# Patient Record
Sex: Female | Born: 1965 | Race: White | Hispanic: No | Marital: Married | Smoking: Former smoker
Health system: Southern US, Community
[De-identification: ages and names within clinical notes are randomized; demographics above are authoritative.]

## PROBLEM LIST (undated history)

## (undated) DIAGNOSIS — E785 Hyperlipidemia, unspecified: Secondary | ICD-10-CM

## (undated) DIAGNOSIS — Z0000 Encounter for general adult medical examination without abnormal findings: Secondary | ICD-10-CM

## (undated) DIAGNOSIS — E663 Overweight: Secondary | ICD-10-CM

## (undated) DIAGNOSIS — L578 Other skin changes due to chronic exposure to nonionizing radiation: Secondary | ICD-10-CM

## (undated) DIAGNOSIS — G4700 Insomnia, unspecified: Secondary | ICD-10-CM

## (undated) DIAGNOSIS — K219 Gastro-esophageal reflux disease without esophagitis: Secondary | ICD-10-CM

## (undated) DIAGNOSIS — B019 Varicella without complication: Secondary | ICD-10-CM

## (undated) DIAGNOSIS — F419 Anxiety disorder, unspecified: Secondary | ICD-10-CM

## (undated) DIAGNOSIS — E162 Hypoglycemia, unspecified: Secondary | ICD-10-CM

## (undated) DIAGNOSIS — G43909 Migraine, unspecified, not intractable, without status migrainosus: Secondary | ICD-10-CM

## (undated) DIAGNOSIS — IMO0002 Reserved for concepts with insufficient information to code with codable children: Secondary | ICD-10-CM

## (undated) DIAGNOSIS — Z8489 Family history of other specified conditions: Secondary | ICD-10-CM

## (undated) DIAGNOSIS — Z8669 Personal history of other diseases of the nervous system and sense organs: Secondary | ICD-10-CM

## (undated) DIAGNOSIS — D249 Benign neoplasm of unspecified breast: Secondary | ICD-10-CM

## (undated) DIAGNOSIS — L409 Psoriasis, unspecified: Secondary | ICD-10-CM

## (undated) DIAGNOSIS — E559 Vitamin D deficiency, unspecified: Principal | ICD-10-CM

## (undated) DIAGNOSIS — Z972 Presence of dental prosthetic device (complete) (partial): Secondary | ICD-10-CM

## (undated) DIAGNOSIS — E049 Nontoxic goiter, unspecified: Secondary | ICD-10-CM

## (undated) DIAGNOSIS — Z973 Presence of spectacles and contact lenses: Secondary | ICD-10-CM

## (undated) DIAGNOSIS — F329 Major depressive disorder, single episode, unspecified: Secondary | ICD-10-CM

## (undated) DIAGNOSIS — E1165 Type 2 diabetes mellitus with hyperglycemia: Secondary | ICD-10-CM

## (undated) DIAGNOSIS — F32A Depression, unspecified: Secondary | ICD-10-CM

## (undated) DIAGNOSIS — K08109 Complete loss of teeth, unspecified cause, unspecified class: Secondary | ICD-10-CM

## (undated) HISTORY — DX: Depression, unspecified: F32.A

## (undated) HISTORY — DX: Vitamin D deficiency, unspecified: E55.9

## (undated) HISTORY — DX: Gastro-esophageal reflux disease without esophagitis: K21.9

## (undated) HISTORY — DX: Benign neoplasm of unspecified breast: D24.9

## (undated) HISTORY — DX: Encounter for general adult medical examination without abnormal findings: Z00.00

## (undated) HISTORY — DX: Psoriasis, unspecified: L40.9

## (undated) HISTORY — DX: Other skin changes due to chronic exposure to nonionizing radiation: L57.8

## (undated) HISTORY — DX: Insomnia, unspecified: G47.00

## (undated) HISTORY — DX: Migraine, unspecified, not intractable, without status migrainosus: G43.909

## (undated) HISTORY — DX: Hyperlipidemia, unspecified: E78.5

## (undated) HISTORY — DX: Hypoglycemia, unspecified: E16.2

## (undated) HISTORY — DX: Nontoxic goiter, unspecified: E04.9

## (undated) HISTORY — DX: Varicella without complication: B01.9

## (undated) HISTORY — DX: Reserved for concepts with insufficient information to code with codable children: IMO0002

## (undated) HISTORY — DX: Personal history of other diseases of the nervous system and sense organs: Z86.69

## (undated) HISTORY — DX: Major depressive disorder, single episode, unspecified: F32.9

## (undated) HISTORY — DX: Anxiety disorder, unspecified: F41.9

## (undated) HISTORY — DX: Overweight: E66.3

## (undated) HISTORY — DX: Type 2 diabetes mellitus with hyperglycemia: E11.65

---

## 1985-08-26 HISTORY — PX: TUBAL LIGATION: SHX77

## 1998-08-26 LAB — HM MAMMOGRAPHY: HM Mammogram: NORMAL

## 2004-08-26 DIAGNOSIS — E162 Hypoglycemia, unspecified: Secondary | ICD-10-CM

## 2004-08-26 HISTORY — DX: Hypoglycemia, unspecified: E16.2

## 2006-08-26 LAB — HM PAP SMEAR: HM Pap smear: NORMAL

## 2011-12-19 ENCOUNTER — Other Ambulatory Visit (HOSPITAL_COMMUNITY)
Admission: RE | Admit: 2011-12-19 | Discharge: 2011-12-19 | Disposition: A | Discharge status: Home / Self Care | Payer: BC Managed Care – PPO | Source: Ambulatory Visit | Attending: Family Medicine | Admitting: Family Medicine

## 2011-12-19 ENCOUNTER — Encounter: Payer: Self-pay | Admitting: Family Medicine

## 2011-12-19 ENCOUNTER — Ambulatory Visit (INDEPENDENT_AMBULATORY_CARE_PROVIDER_SITE_OTHER): Payer: BC Managed Care – PPO | Admitting: Family Medicine

## 2011-12-19 VITALS — BP 123/84 | HR 94 | Temp 98.5°F | Ht 62.5 in | Wt 120.4 lb

## 2011-12-19 DIAGNOSIS — Z124 Encounter for screening for malignant neoplasm of cervix: Secondary | ICD-10-CM

## 2011-12-19 DIAGNOSIS — E119 Type 2 diabetes mellitus without complications: Secondary | ICD-10-CM

## 2011-12-19 DIAGNOSIS — Z23 Encounter for immunization: Secondary | ICD-10-CM

## 2011-12-19 DIAGNOSIS — N760 Acute vaginitis: Secondary | ICD-10-CM | POA: Insufficient documentation

## 2011-12-19 DIAGNOSIS — Z113 Encounter for screening for infections with a predominantly sexual mode of transmission: Secondary | ICD-10-CM | POA: Insufficient documentation

## 2011-12-19 DIAGNOSIS — K219 Gastro-esophageal reflux disease without esophagitis: Secondary | ICD-10-CM

## 2011-12-19 DIAGNOSIS — L408 Other psoriasis: Secondary | ICD-10-CM

## 2011-12-19 DIAGNOSIS — Z01419 Encounter for gynecological examination (general) (routine) without abnormal findings: Secondary | ICD-10-CM | POA: Insufficient documentation

## 2011-12-19 DIAGNOSIS — E162 Hypoglycemia, unspecified: Secondary | ICD-10-CM

## 2011-12-19 DIAGNOSIS — Z8669 Personal history of other diseases of the nervous system and sense organs: Secondary | ICD-10-CM | POA: Insufficient documentation

## 2011-12-19 DIAGNOSIS — F341 Dysthymic disorder: Secondary | ICD-10-CM

## 2011-12-19 DIAGNOSIS — K649 Unspecified hemorrhoids: Secondary | ICD-10-CM

## 2011-12-19 DIAGNOSIS — J309 Allergic rhinitis, unspecified: Secondary | ICD-10-CM

## 2011-12-19 DIAGNOSIS — E1165 Type 2 diabetes mellitus with hyperglycemia: Secondary | ICD-10-CM | POA: Insufficient documentation

## 2011-12-19 DIAGNOSIS — F419 Anxiety disorder, unspecified: Secondary | ICD-10-CM

## 2011-12-19 DIAGNOSIS — Z0000 Encounter for general adult medical examination without abnormal findings: Secondary | ICD-10-CM

## 2011-12-19 DIAGNOSIS — B379 Candidiasis, unspecified: Secondary | ICD-10-CM

## 2011-12-19 DIAGNOSIS — F32A Depression, unspecified: Secondary | ICD-10-CM | POA: Insufficient documentation

## 2011-12-19 DIAGNOSIS — F329 Major depressive disorder, single episode, unspecified: Secondary | ICD-10-CM | POA: Insufficient documentation

## 2011-12-19 DIAGNOSIS — L409 Psoriasis, unspecified: Secondary | ICD-10-CM

## 2011-12-19 DIAGNOSIS — E049 Nontoxic goiter, unspecified: Secondary | ICD-10-CM

## 2011-12-19 LAB — HEPATIC FUNCTION PANEL
ALT: 14 U/L (ref 0–35)
Alkaline Phosphatase: 82 U/L (ref 39–117)
Bilirubin, Direct: 0 mg/dL (ref 0.0–0.3)
Total Bilirubin: 0.2 mg/dL — ABNORMAL LOW (ref 0.3–1.2)

## 2011-12-19 LAB — CBC
MCV: 90.8 fl (ref 78.0–100.0)
RBC: 4.47 Mil/uL (ref 3.87–5.11)
WBC: 7 10*3/uL (ref 4.5–10.5)

## 2011-12-19 LAB — RENAL FUNCTION PANEL
Calcium: 9 mg/dL (ref 8.4–10.5)
Chloride: 101 mEq/L (ref 96–112)
Glucose, Bld: 269 mg/dL — ABNORMAL HIGH (ref 70–99)
Phosphorus: 2.5 mg/dL (ref 2.3–4.6)
Potassium: 3.4 mEq/L — ABNORMAL LOW (ref 3.5–5.1)
Sodium: 136 mEq/L (ref 135–145)

## 2011-12-19 LAB — LIPID PANEL
Cholesterol: 220 mg/dL — ABNORMAL HIGH (ref 0–200)
HDL: 47.9 mg/dL
Total CHOL/HDL Ratio: 5
Triglycerides: 118 mg/dL (ref 0.0–149.0)
VLDL: 23.6 mg/dL (ref 0.0–40.0)

## 2011-12-19 LAB — LDL CHOLESTEROL, DIRECT: Direct LDL: 152.7 mg/dL

## 2011-12-19 MED ORDER — NYSTATIN-TRIAMCINOLONE 100000-0.1 UNIT/GM-% EX OINT: TOPICAL_OINTMENT | Freq: Two times a day (BID) | CUTANEOUS | Status: DC | PRN

## 2011-12-19 MED ORDER — FLUCONAZOLE 150 MG PO TABS: ORAL_TABLET | ORAL | Status: DC

## 2011-12-19 NOTE — Patient Instructions (Signed)
Preventive Care for Adults, Female A healthy lifestyle and preventive care can promote health and wellness. Preventive health guidelines for women include the following key practices.  A routine yearly physical is a good way to check with your caregiver about your health and preventive screening. It is a chance to share any concerns and updates on your health, and to receive a thorough exam.   Visit your dentist for a routine exam and preventive care every 6 months. Brush your teeth twice a day and floss once a day. Good oral hygiene prevents tooth decay and gum disease.   The frequency of eye exams is based on your age, health, family medical history, use of contact lenses, and other factors. Follow your caregiver's recommendations for frequency of eye exams.   Eat a healthy diet. Foods like vegetables, fruits, whole grains, low-fat dairy products, and lean protein foods contain the nutrients you need without too many calories. Decrease your intake of foods high in solid fats, added sugars, and salt. Eat the right amount of calories for you.Get information about a proper diet from your caregiver, if necessary.   Regular physical exercise is one of the most important things you can do for your health. Most adults should get at least 150 minutes of moderate-intensity exercise (any activity that increases your heart rate and causes you to sweat) each week. In addition, most adults need muscle-strengthening exercises on 2 or more days a week.   Maintain a healthy weight. The body mass index (BMI) is a screening tool to identify possible weight problems. It provides an estimate of body fat based on height and weight. Your caregiver can help determine your BMI, and can help you achieve or maintain a healthy weight.For adults 20 years and older:   A BMI below 18.5 is considered underweight.   A BMI of 18.5 to 24.9 is normal.   A BMI of 25 to 29.9 is considered overweight.   A BMI of 30 and above is  considered obese.   Maintain normal blood lipids and cholesterol levels by exercising and minimizing your intake of saturated fat. Eat a balanced diet with plenty of fruit and vegetables. Blood tests for lipids and cholesterol should begin at age 20 and be repeated every 5 years. If your lipid or cholesterol levels are high, you are over 50, or you are at high risk for heart disease, you may need your cholesterol levels checked more frequently.Ongoing high lipid and cholesterol levels should be treated with medicines if diet and exercise are not effective.   If you smoke, find out from your caregiver how to quit. If you do not use tobacco, do not start.   If you are pregnant, do not drink alcohol. If you are breastfeeding, be very cautious about drinking alcohol. If you are not pregnant and choose to drink alcohol, do not exceed 1 drink per day. One drink is considered to be 12 ounces (355 mL) of beer, 5 ounces (148 mL) of wine, or 1.5 ounces (44 mL) of liquor.   Avoid use of street drugs. Do not share needles with anyone. Ask for help if you need support or instructions about stopping the use of drugs.   High blood pressure causes heart disease and increases the risk of stroke. Your blood pressure should be checked at least every 1 to 2 years. Ongoing high blood pressure should be treated with medicines if weight loss and exercise are not effective.   If you are 55 to 48   years old, ask your caregiver if you should take aspirin to prevent strokes.   Diabetes screening involves taking a blood sample to check your fasting blood sugar level. This should be done once every 3 years, after age 45, if you are within normal weight and without risk factors for diabetes. Testing should be considered at a younger age or be carried out more frequently if you are overweight and have at least 1 risk factor for diabetes.   Breast cancer screening is essential preventive care for women. You should practice "breast  self-awareness." This means understanding the normal appearance and feel of your breasts and may include breast self-examination. Any changes detected, no matter how small, should be reported to a caregiver. Women in their 20s and 30s should have a clinical breast exam (CBE) by a caregiver as part of a regular health exam every 1 to 3 years. After age 40, women should have a CBE every year. Starting at age 40, women should consider having a mammography (breast X-ray test) every year. Women who have a family history of breast cancer should talk to their caregiver about genetic screening. Women at a high risk of breast cancer should talk to their caregivers about having magnetic resonance imaging (MRI) and a mammography every year.   The Pap test is a screening test for cervical cancer. A Pap test can show cell changes on the cervix that might become cervical cancer if left untreated. A Pap test is a procedure in which cells are obtained and examined from the lower end of the uterus (cervix).   Women should have a Pap test starting at age 21.   Between ages 21 and 29, Pap tests should be repeated every 2 years.   Beginning at age 30, you should have a Pap test every 3 years as long as the past 3 Pap tests have been normal.   Some women have medical problems that increase the chance of getting cervical cancer. Talk to your caregiver about these problems. It is especially important to talk to your caregiver if a new problem develops soon after your last Pap test. In these cases, your caregiver may recommend more frequent screening and Pap tests.   The above recommendations are the same for women who have or have not gotten the vaccine for human papillomavirus (HPV).   If you had a hysterectomy for a problem that was not cancer or a condition that could lead to cancer, then you no longer need Pap tests. Even if you no longer need a Pap test, a regular exam is a good idea to make sure no other problems are  starting.   If you are between ages 65 and 70, and you have had normal Pap tests going back 10 years, you no longer need Pap tests. Even if you no longer need a Pap test, a regular exam is a good idea to make sure no other problems are starting.   If you have had past treatment for cervical cancer or a condition that could lead to cancer, you need Pap tests and screening for cancer for at least 20 years after your treatment.   If Pap tests have been discontinued, risk factors (such as a new sexual partner) need to be reassessed to determine if screening should be resumed.   The HPV test is an additional test that may be used for cervical cancer screening. The HPV test looks for the virus that can cause the cell changes on the cervix.   The cells collected during the Pap test can be tested for HPV. The HPV test could be used to screen women aged 30 years and older, and should be used in women of any age who have unclear Pap test results. After the age of 30, women should have HPV testing at the same frequency as a Pap test.   Colorectal cancer can be detected and often prevented. Most routine colorectal cancer screening begins at the age of 50 and continues through age 75. However, your caregiver may recommend screening at an earlier age if you have risk factors for colon cancer. On a yearly basis, your caregiver may provide home test kits to check for hidden blood in the stool. Use of a small camera at the end of a tube, to directly examine the colon (sigmoidoscopy or colonoscopy), can detect the earliest forms of colorectal cancer. Talk to your caregiver about this at age 50, when routine screening begins. Direct examination of the colon should be repeated every 5 to 10 years through age 75, unless early forms of pre-cancerous polyps or small growths are found.   Hepatitis C blood testing is recommended for all people born from 1945 through 1965 and any individual with known risks for hepatitis C.    Practice safe sex. Use condoms and avoid high-risk sexual practices to reduce the spread of sexually transmitted infections (STIs). STIs include gonorrhea, chlamydia, syphilis, trichomonas, herpes, HPV, and human immunodeficiency virus (HIV). Herpes, HIV, and HPV are viral illnesses that have no cure. They can result in disability, cancer, and death. Sexually active women aged 25 and younger should be checked for chlamydia. Older women with new or multiple partners should also be tested for chlamydia. Testing for other STIs is recommended if you are sexually active and at increased risk.   Osteoporosis is a disease in which the bones lose minerals and strength with aging. This can result in serious bone fractures. The risk of osteoporosis can be identified using a bone density scan. Women ages 65 and over and women at risk for fractures or osteoporosis should discuss screening with their caregivers. Ask your caregiver whether you should take a calcium supplement or vitamin D to reduce the rate of osteoporosis.   Menopause can be associated with physical symptoms and risks. Hormone replacement therapy is available to decrease symptoms and risks. You should talk to your caregiver about whether hormone replacement therapy is right for you.   Use sunscreen with sun protection factor (SPF) of 30 or more. Apply sunscreen liberally and repeatedly throughout the day. You should seek shade when your shadow is shorter than you. Protect yourself by wearing long sleeves, pants, a wide-brimmed hat, and sunglasses year round, whenever you are outdoors.   Once a month, do a whole body skin exam, using a mirror to look at the skin on your back. Notify your caregiver of new moles, moles that have irregular borders, moles that are larger than a pencil eraser, or moles that have changed in shape or color.   Stay current with required immunizations.   Influenza. You need a dose every fall (or winter). The composition of  the flu vaccine changes each year, so being vaccinated once is not enough.   Pneumococcal polysaccharide. You need 1 to 2 doses if you smoke cigarettes or if you have certain chronic medical conditions. You need 1 dose at age 65 (or older) if you have never been vaccinated.   Tetanus, diphtheria, pertussis (Tdap, Td). Get 1 dose of   Tdap vaccine if you are younger than age 65, are over 65 and have contact with an infant, are a healthcare worker, are pregnant, or simply want to be protected from whooping cough. After that, you need a Td booster dose every 10 years. Consult your caregiver if you have not had at least 3 tetanus and diphtheria-containing shots sometime in your life or have a deep or dirty wound.   HPV. You need this vaccine if you are a woman age 26 or younger. The vaccine is given in 3 doses over 6 months.   Measles, mumps, rubella (MMR). You need at least 1 dose of MMR if you were born in 1957 or later. You may also need a second dose.   Meningococcal. If you are age 19 to 21 and a first-year college student living in a residence hall, or have one of several medical conditions, you need to get vaccinated against meningococcal disease. You may also need additional booster doses.   Zoster (shingles). If you are age 60 or older, you should get this vaccine.   Varicella (chickenpox). If you have never had chickenpox or you were vaccinated but received only 1 dose, talk to your caregiver to find out if you need this vaccine.   Hepatitis A. You need this vaccine if you have a specific risk factor for hepatitis A virus infection or you simply wish to be protected from this disease. The vaccine is usually given as 2 doses, 6 to 18 months apart.   Hepatitis B. You need this vaccine if you have a specific risk factor for hepatitis B virus infection or you simply wish to be protected from this disease. The vaccine is given in 3 doses, usually over 6 months.  Preventive Services /  Frequency Ages 19 to 39  Blood pressure check.** / Every 1 to 2 years.   Lipid and cholesterol check.** / Every 5 years beginning at age 20.   Clinical breast exam.** / Every 3 years for women in their 20s and 30s.   Pap test.** / Every 2 years from ages 21 through 29. Every 3 years starting at age 30 through age 65 or 70 with a history of 3 consecutive normal Pap tests.   HPV screening.** / Every 3 years from ages 30 through ages 65 to 70 with a history of 3 consecutive normal Pap tests.   Hepatitis C blood test.** / For any individual with known risks for hepatitis C.   Skin self-exam. / Monthly.   Influenza immunization.** / Every year.   Pneumococcal polysaccharide immunization.** / 1 to 2 doses if you smoke cigarettes or if you have certain chronic medical conditions.   Tetanus, diphtheria, pertussis (Tdap, Td) immunization. / A one-time dose of Tdap vaccine. After that, you need a Td booster dose every 10 years.   HPV immunization. / 3 doses over 6 months, if you are 26 and younger.   Measles, mumps, rubella (MMR) immunization. / You need at least 1 dose of MMR if you were born in 1957 or later. You may also need a second dose.   Meningococcal immunization. / 1 dose if you are age 19 to 21 and a first-year college student living in a residence hall, or have one of several medical conditions, you need to get vaccinated against meningococcal disease. You may also need additional booster doses.   Varicella immunization.** / Consult your caregiver.   Hepatitis A immunization.** / Consult your caregiver. 2 doses, 6 to 18 months   apart.   Hepatitis B immunization.** / Consult your caregiver. 3 doses usually over 6 months.  Ages 40 to 64  Blood pressure check.** / Every 1 to 2 years.   Lipid and cholesterol check.** / Every 5 years beginning at age 20.   Clinical breast exam.** / Every year after age 40.   Mammogram.** / Every year beginning at age 40 and continuing for as  long as you are in good health. Consult with your caregiver.   Pap test.** / Every 3 years starting at age 30 through age 65 or 70 with a history of 3 consecutive normal Pap tests.   HPV screening.** / Every 3 years from ages 30 through ages 65 to 70 with a history of 3 consecutive normal Pap tests.   Fecal occult blood test (FOBT) of stool. / Every year beginning at age 50 and continuing until age 75. You may not need to do this test if you get a colonoscopy every 10 years.   Flexible sigmoidoscopy or colonoscopy.** / Every 5 years for a flexible sigmoidoscopy or every 10 years for a colonoscopy beginning at age 50 and continuing until age 75.   Hepatitis C blood test.** / For all people born from 1945 through 1965 and any individual with known risks for hepatitis C.   Skin self-exam. / Monthly.   Influenza immunization.** / Every year.   Pneumococcal polysaccharide immunization.** / 1 to 2 doses if you smoke cigarettes or if you have certain chronic medical conditions.   Tetanus, diphtheria, pertussis (Tdap, Td) immunization.** / A one-time dose of Tdap vaccine. After that, you need a Td booster dose every 10 years.   Measles, mumps, rubella (MMR) immunization. / You need at least 1 dose of MMR if you were born in 1957 or later. You may also need a second dose.   Varicella immunization.** / Consult your caregiver.   Meningococcal immunization.** / Consult your caregiver.   Hepatitis A immunization.** / Consult your caregiver. 2 doses, 6 to 18 months apart.   Hepatitis B immunization.** / Consult your caregiver. 3 doses, usually over 6 months.  Ages 65 and over  Blood pressure check.** / Every 1 to 2 years.   Lipid and cholesterol check.** / Every 5 years beginning at age 20.   Clinical breast exam.** / Every year after age 40.   Mammogram.** / Every year beginning at age 40 and continuing for as long as you are in good health. Consult with your caregiver.   Pap test.** /  Every 3 years starting at age 30 through age 65 or 70 with a 3 consecutive normal Pap tests. Testing can be stopped between 65 and 70 with 3 consecutive normal Pap tests and no abnormal Pap or HPV tests in the past 10 years.   HPV screening.** / Every 3 years from ages 30 through ages 65 or 70 with a history of 3 consecutive normal Pap tests. Testing can be stopped between 65 and 70 with 3 consecutive normal Pap tests and no abnormal Pap or HPV tests in the past 10 years.   Fecal occult blood test (FOBT) of stool. / Every year beginning at age 50 and continuing until age 75. You may not need to do this test if you get a colonoscopy every 10 years.   Flexible sigmoidoscopy or colonoscopy.** / Every 5 years for a flexible sigmoidoscopy or every 10 years for a colonoscopy beginning at age 50 and continuing until age 75.   Hepatitis   C blood test.** / For all people born from [birth year] through 1965 and any individual with known risks for hepatitis C.   Osteoporosis screening.** / A one-time screening for women ages 63 and over and women at risk for fractures or osteoporosis.   Skin self-exam. / Monthly.   Influenza immunization.** / Every year.   Pneumococcal polysaccharide immunization.** / 1 dose at age 45 (or older) if you have never been vaccinated.   Tetanus, diphtheria, pertussis (Tdap, Td) immunization. / A one-time dose of Tdap vaccine if you are over 65 and have contact with an infant, are a Research scientist (physical sciences), or simply want to be protected from whooping cough. After that, you need a Td booster dose every 10 years.   Varicella immunization.** / Consult your caregiver.   Meningococcal immunization.** / Consult your caregiver.   Hepatitis A immunization.** / Consult your caregiver. 2 doses, 6 to 18 months apart.   Hepatitis B immunization.** / Check with your caregiver. 3 doses, usually over 6 months.  ** Family history and personal history of risk and conditions may change your caregiver's  recommendations. Document Released: 10/08/2001 Document Revised: 08/01/2011 Document Reviewed: 01/07/2011 [facility] Patient Information 2012 [place], [place].   For hemorrhoids use Witch Hazel Astringent to clean  For bowels, add a fiber supplement, such as Benefiber and Metamucil, with 64 oz of clear fluids and minimize caffeine and alcohol, hi fiber diet  Add a yogurt daily and a probiotic such as Align caps daily

## 2011-12-20 ENCOUNTER — Ambulatory Visit (HOSPITAL_BASED_OUTPATIENT_CLINIC_OR_DEPARTMENT_OTHER)
Admission: RE | Admit: 2011-12-20 | Discharge: 2011-12-20 | Disposition: A | Discharge status: Home / Self Care | Payer: BC Managed Care – PPO | Source: Ambulatory Visit | Attending: Family Medicine | Admitting: Family Medicine

## 2011-12-20 DIAGNOSIS — E049 Nontoxic goiter, unspecified: Secondary | ICD-10-CM

## 2011-12-20 MED ORDER — METFORMIN HCL 500 MG PO TABS: 500.0000 mg | ORAL_TABLET | Freq: Two times a day (BID) | ORAL | Status: DC

## 2011-12-23 ENCOUNTER — Ambulatory Visit (INDEPENDENT_AMBULATORY_CARE_PROVIDER_SITE_OTHER): Payer: BC Managed Care – PPO | Admitting: Family Medicine

## 2011-12-23 ENCOUNTER — Encounter: Payer: Self-pay | Admitting: Family Medicine

## 2011-12-23 VITALS — BP 122/84 | HR 102 | Temp 98.0°F | Ht 62.5 in | Wt 122.8 lb

## 2011-12-23 DIAGNOSIS — E1165 Type 2 diabetes mellitus with hyperglycemia: Secondary | ICD-10-CM

## 2011-12-23 MED ORDER — LISINOPRIL 5 MG PO TABS: 5.0000 mg | ORAL_TABLET | Freq: Every day | ORAL | Status: DC

## 2011-12-23 MED ORDER — INSULIN GLARGINE 100 UNIT/ML ~~LOC~~ SOLN: 10.0000 [IU] | Freq: Every day | SUBCUTANEOUS | Status: DC

## 2011-12-23 NOTE — Patient Instructions (Signed)
Type 2 Diabetes Diabetes is a long-lasting (chronic) disease. One or both of the following happen with type 2 diabetes:   The pancreas does not make enough of a hormone called insulin.   The body has trouble using the insulin that is made.  HOME CARE  Check your blood sugar (glucose) once a day, or as told by your doctor.   Take all medicine as told by your doctor.   Do not smoke.   Eat healthy foods. Weight loss can help your diabetes.   Learn about low blood sugar (hypoglycemia). Know how to treat it.   Get your eyes checked on a regular basis.   Get a physical exam every year. Get your blood pressure checked. Get your blood and pee (urine) tested.   Wear a necklace or bracelet that says you have diabetes.   Check your feet every night for cuts, sores, blisters, and redness. Tell your doctor if you have problems.  GET HELP RIGHT AWAY IF:  You have trouble keeping your blood sugar in target range.   You have problems with your medicines.   You are sick and not getting better after 24 hours.   You have a sore or wound that is not healing.   You have vision problems or changes.   You have a fever.  MAKE SURE YOU:  Understand these instructions.   Will watch your condition.   Will get help right away if you are not doing well or get worse.  Document Released: 05/21/2008 Document Revised: 08/01/2011 Document Reviewed: 01/28/2011 [facility] Patient Information 2012 [place], [place].     Check blood sugar in am prior to eating, 1 hour after dinner, at bedtime and as needed. We ultimately want BS 80 to 120 in am but for now will be happy with BS 150 to 180

## 2011-12-24 ENCOUNTER — Telehealth: Payer: Self-pay

## 2011-12-24 LAB — MICROALBUMIN, URINE: Microalb, Ur: 23.22 mg/dL — ABNORMAL HIGH (ref 0.00–1.89)

## 2011-12-24 MED ORDER — FREESTYLE LANCETS MISC: 1.0000 | Freq: Four times a day (QID) | Status: DC

## 2011-12-24 MED ORDER — GLUCOSE BLOOD VI STRP: 1.0000 | ORAL_STRIP | Freq: Four times a day (QID) | Status: DC

## 2011-12-24 NOTE — Telephone Encounter (Signed)
Patient informed and stated she understood

## 2011-12-24 NOTE — Telephone Encounter (Signed)
Notify Lantus 10 units qhs is the way to go. Let us know numbers in a couple days

## 2011-12-24 NOTE — Telephone Encounter (Signed)
Pt states her BS last night was 266 an hour after dinner. At bedtime 311 (about 2 hours apart). This morning at 5 am 234 and about 840 am 203. Please advise units of insulin?   I sent in an RX for test strips and lancets

## 2011-12-26 ENCOUNTER — Encounter: Payer: Self-pay | Admitting: Family Medicine

## 2011-12-26 ENCOUNTER — Telehealth: Payer: Self-pay

## 2011-12-26 DIAGNOSIS — L409 Psoriasis, unspecified: Secondary | ICD-10-CM

## 2011-12-26 DIAGNOSIS — K649 Unspecified hemorrhoids: Secondary | ICD-10-CM | POA: Insufficient documentation

## 2011-12-26 DIAGNOSIS — Z0000 Encounter for general adult medical examination without abnormal findings: Secondary | ICD-10-CM

## 2011-12-26 DIAGNOSIS — E049 Nontoxic goiter, unspecified: Secondary | ICD-10-CM

## 2011-12-26 HISTORY — DX: Encounter for general adult medical examination without abnormal findings: Z00.00

## 2011-12-26 HISTORY — DX: Psoriasis, unspecified: L40.9

## 2011-12-26 HISTORY — DX: Nontoxic goiter, unspecified: E04.9

## 2011-12-26 NOTE — Assessment & Plan Note (Signed)
Continue Metformin and add Lantus 10 units qhs, minimize simple carbs, keep a sugar log and if numbers remain above 180 let us know.

## 2011-12-26 NOTE — Progress Notes (Signed)
Patient ID: Robin Barry, female   DOB: [birth year], 46 y.o.   MRN: [id number] Robin Barry [id number] 08-10-1966 12/26/2011      Progress Note-Follow Up  Subjective  Chief Complaint  Chief Complaint  Patient presents with  . discuss BS    give patient a glucometer    HPI  Patient is a 46 year old Caucasian female who is here today for glucometer and start testing her sugars. Her hemoglobin A1c came back at over 14 earlier last week. She hurts in her diet and we started metformin over the weekend and she says she does feel better. Less fatigued and less polyuria. No chest pain, illness or concerns.  Past Medical History  Diagnosis Date  . Chicken pox as a child  . Measles as a child  . Allergic rhinitis     ragweed  . GERD (gastroesophageal reflux disease)   . Hypoglycemia 2006  . Anxiety   . Depression   . H/O otitis media     childhood  . Anxiety and depression   . Hemorrhoids 12/26/2011  . Enlarged thyroid 12/26/2011  . Preventative health care 12/26/2011  . Psoriasis 12/26/2011  . Diabetes mellitus type 2, uncontrolled     Past Surgical History  Procedure Date  . Tubal ligation 1987    Family History  Problem Relation Age of Onset  . COPD Mother     smokes  . Leukemia Mother     chronic lymphatic  . Depression Mother   . Cancer Mother     skin, CLL  . Hypertension Father   . Cancer Father     prostate  . Diabetes Brother 20    Type 1  . Kidney disease Brother     dialysis  . Other Son     brain hemorrhage  . Cancer Maternal Grandmother     breast  . Alzheimer's disease Maternal Grandmother   . Cancer Maternal Grandfather     melanoma  . Cancer Paternal Grandmother     breast  . Emphysema Paternal Grandmother   . Alcohol abuse Paternal Grandfather   . Cancer Paternal Grandfather     prostate    History   Social History  . Marital Status: Married    Spouse Name: N/A    Number of Children: N/A  . Years of Education: N/A   Occupational  History  . Not on file.   Social History Main Topics  . Smoking status: Former Smoker -- 0.5 packs/day for 20 years    Types: Cigarettes    Quit date: 01/24/2010  . Smokeless tobacco: Never Used  . Alcohol Use: Yes     occasionally a little bit of wine  . Drug Use: No  . Sexually Active: Yes -- Female partner(s)   Other Topics Concern  . Not on file   Social History Narrative  . No narrative on file    Current Outpatient Prescriptions on File Prior to Visit  Medication Sig Dispense Refill  . cetirizine (ZYRTEC) 10 MG tablet Take 10 mg by mouth 2 (two) times daily.      . fluconazole (DIFLUCAN) 150 MG tablet 1 tab po daily x 3 day repeat in 1 week  6 tablet  0  . metFORMIN (GLUCOPHAGE) 500 MG tablet Take 1 tablet (500 mg total) by mouth 2 (two) times daily with a meal.  60 tablet  1  . nystatin-triamcinolone ointment (MYCOLOG) Apply topically 2 (two) times daily as needed. Discharge and pruritus  60 g  2  . omeprazole (PRILOSEC) 10 MG capsule Take 10 mg by mouth daily.      . Triamcinolone Acetonide (TRIAMCINOLONE 0.1 % CREAM : EUCERIN) CREA Apply 1 application topically 2 (two) times daily.      . insulin glargine (LANTUS SOLOSTAR) 100 UNIT/ML injection Inject 10 Units into the skin at bedtime.  2 pen  PRN  . lisinopril (PRINIVIL,ZESTRIL) 5 MG tablet Take 1 tablet (5 mg total) by mouth daily.  90 tablet  3    Allergies  Allergen Reactions  . Bactrim     rash  . Cephalexin     rash  . Codeine     hallucinations  . Terramycin     Review of Systems  Review of Systems  Constitutional: Negative for fever and malaise/fatigue.  HENT: Negative for congestion.   Eyes: Negative for discharge.  Respiratory: Negative for shortness of breath.   Cardiovascular: Negative for chest pain, palpitations and leg swelling.  Gastrointestinal: Negative for nausea, abdominal pain and diarrhea.  Genitourinary: Negative for dysuria.  Musculoskeletal: Negative for falls.  Skin: Negative for  rash.  Neurological: Negative for loss of consciousness and headaches.  Endo/Heme/Allergies: Negative for polydipsia.  Psychiatric/Behavioral: Negative for depression and suicidal ideas. The patient is not nervous/anxious and does not have insomnia.     Objective  BP 122/84  Pulse 102  Temp(Src) 98 F (36.7 C) (Temporal)  Ht 5' 2.5" (1.588 m)  Wt 122 lb 12.8 oz (55.702 kg)  BMI 22.10 kg/m2  SpO2 100%  LMP 12/21/2011  Physical Exam  Physical Exam  Constitutional: She is oriented to person, place, and time and well-developed, well-nourished, and in no distress. No distress.  HENT:  Head: Normocephalic and atraumatic.  Eyes: Conjunctivae are normal.  Neck: Neck supple. No thyromegaly present.  Cardiovascular: Normal rate, regular rhythm and normal heart sounds.   No murmur heard. Pulmonary/Chest: Effort normal and breath sounds normal. She has no wheezes.  Abdominal: She exhibits no distension and no mass.  Musculoskeletal: She exhibits no edema.  Lymphadenopathy:    She has no cervical adenopathy.  Neurological: She is alert and oriented to person, place, and time.  Skin: Skin is warm and dry. No rash noted. She is not diaphoretic.  Psychiatric: Memory, affect and judgment normal.    Lab Results  Component Value Date   TSH 0.46 12/19/2011   Lab Results  Component Value Date   WBC 7.0 12/19/2011   HGB 13.5 12/19/2011   HCT 40.6 12/19/2011   MCV 90.8 12/19/2011   PLT 350.0 12/19/2011   Lab Results  Component Value Date   CREATININE 0.7 12/19/2011   BUN 7 12/19/2011   NA 136 12/19/2011   K 3.4* 12/19/2011   CL 101 12/19/2011   CO2 25 12/19/2011   Lab Results  Component Value Date   ALT 14 12/19/2011   AST 15 12/19/2011   ALKPHOS 82 12/19/2011   BILITOT 0.2* 12/19/2011   Lab Results  Component Value Date   CHOL 220* 12/19/2011   Lab Results  Component Value Date   HDL 47.90 12/19/2011   No results found for this basename: LDLCALC   Lab Results  Component Value  Date   TRIG 118.0 12/19/2011   Lab Results  Component Value Date   CHOLHDL 5 12/19/2011     Assessment & Plan  Diabetes mellitus type 2, uncontrolled Continue Metformin and add Lantus 10 units qhs, minimize simple carbs, keep a sugar log and if numbers remain above  180 let us know.

## 2011-12-26 NOTE — Progress Notes (Signed)
Patient ID: Robin Barry, female   DOB: 08/01/1966, 46 y.o.   MRN: [id number] Robin Barry [id number] 10-30-1965 12/26/2011      Progress Note New Patient  Subjective  Chief Complaint  Chief Complaint  Patient presents with  . Establish Care    New patient/ [facility] said thyroid felt enlarged    HPI  Patient is a 46 year old Caucasian female in today for new patient appointment. She was recently seen in the clinic for sore throat. She realized when she did that she needed a primary. She's not had a Pap smear for primary care since 2003 he urgent care told her thyroid was enlarged. She does historically she's been told she has psoriasis. She occasionally has trouble with heartburn and allergies neither of which are terrible at the moment. She has previously been told she has hypoglycemia. She denies chest pain, palpitations, shortness of breath, GI or GU complaints at this time.  Past Medical History  Diagnosis Date  . Chicken pox as a child  . Measles as a child  . Allergic rhinitis     ragweed  . GERD (gastroesophageal reflux disease)   . Hypoglycemia 2006  . Anxiety   . Depression   . H/O otitis media     childhood  . Anxiety and depression   . Hemorrhoids 12/26/2011  . Enlarged thyroid 12/26/2011  . Preventative health care 12/26/2011    Past Surgical History  Procedure Date  . Tubal ligation 1987    Family History  Problem Relation Age of Onset  . COPD Mother     smokes  . Leukemia Mother     chronic lymphatic  . Depression Mother   . Cancer Mother     skin, CLL  . Hypertension Father   . Cancer Father     prostate  . Diabetes Brother 20    Type 1  . Kidney disease Brother     dialysis  . Other Son     brain hemorrhage  . Cancer Maternal Grandmother     breast  . Alzheimer's disease Maternal Grandmother   . Cancer Maternal Grandfather     melanoma  . Cancer Paternal Grandmother     breast  . Emphysema Paternal Grandmother   . Alcohol abuse  Paternal Grandfather   . Cancer Paternal Grandfather     prostate    History   Social History  . Marital Status: Married    Spouse Name: N/A    Number of Children: N/A  . Years of Education: N/A   Occupational History  . Not on file.   Social History Main Topics  . Smoking status: Former Smoker -- 0.5 packs/day for 20 years    Types: Cigarettes    Quit date: 01/24/2010  . Smokeless tobacco: Never Used  . Alcohol Use: Yes     occasionally a little bit of wine  . Drug Use: No  . Sexually Active: Yes -- Female partner(s)   Other Topics Concern  . Not on file   Social History Narrative  . No narrative on file    Current Outpatient Prescriptions on File Prior to Visit  Medication Sig Dispense Refill  . cetirizine (ZYRTEC) 10 MG tablet Take 10 mg by mouth 2 (two) times daily.      . insulin glargine (LANTUS SOLOSTAR) 100 UNIT/ML injection Inject 10 Units into the skin at bedtime.  2 pen  PRN  . lisinopril (PRINIVIL,ZESTRIL) 5 MG tablet Take 1 tablet (5 mg total) by  mouth daily.  90 tablet  3  . metFORMIN (GLUCOPHAGE) 500 MG tablet Take 1 tablet (500 mg total) by mouth 2 (two) times daily with a meal.  60 tablet  1  . omeprazole (PRILOSEC) 10 MG capsule Take 10 mg by mouth daily.        Allergies  Allergen Reactions  . Bactrim     rash  . Cephalexin     rash  . Codeine     hallucinations  . Terramycin     Review of Systems  Review of Systems  Constitutional: Positive for malaise/fatigue. Negative for fever and chills.  HENT: Positive for congestion. Negative for hearing loss and nosebleeds.   Eyes: Negative for discharge.  Respiratory: Negative for cough, sputum production, shortness of breath and wheezing.   Cardiovascular: Negative for chest pain, palpitations and leg swelling.  Gastrointestinal: Positive for heartburn. Negative for nausea, vomiting, abdominal pain, diarrhea, constipation and blood in stool.  Genitourinary: Negative for dysuria, urgency,  frequency and hematuria.  Musculoskeletal: Negative for myalgias, back pain and falls.  Skin: Negative for rash.  Neurological: Negative for dizziness, tremors, sensory change, focal weakness, loss of consciousness, weakness and headaches.  Endo/Heme/Allergies: Negative for polydipsia. Does not bruise/bleed easily.  Psychiatric/Behavioral: Negative for depression and suicidal ideas. The patient is not nervous/anxious and does not have insomnia.     Objective  BP 123/84  Pulse 94  Temp(Src) 98.5 F (36.9 C) (Temporal)  Ht 5' 2.5" (1.588 m)  Wt 120 lb 6.4 oz (54.613 kg)  BMI 21.67 kg/m2  SpO2 100%  LMP 11/23/2011  Physical Exam  Physical Exam  Constitutional: She is oriented to person, place, and time and well-developed, well-nourished, and in no distress. No distress.  HENT:  Head: Normocephalic and atraumatic.  Right Ear: External ear normal.  Left Ear: External ear normal.  Nose: Nose normal.  Mouth/Throat: Oropharynx is clear and moist. No oropharyngeal exudate.  Eyes: Conjunctivae are normal. Pupils are equal, round, and reactive to light. Right eye exhibits no discharge. Left eye exhibits no discharge. No scleral icterus.  Neck: Normal range of motion. Neck supple. No thyromegaly present.  Cardiovascular: Normal rate, regular rhythm, normal heart sounds and intact distal pulses.   No murmur heard. Pulmonary/Chest: Effort normal and breath sounds normal. No respiratory distress. She has no wheezes. She has no rales.  Abdominal: Soft. Bowel sounds are normal. She exhibits no distension and no mass. There is no tenderness.  Musculoskeletal: Normal range of motion. She exhibits no edema and no tenderness.  Lymphadenopathy:    She has no cervical adenopathy.  Neurological: She is alert and oriented to person, place, and time. She has normal reflexes. No cranial nerve deficit. Coordination normal.  Skin: Skin is warm and dry. No rash noted. She is not diaphoretic.  Psychiatric:  Mood, memory and affect normal.       Assessment & Plan  Hypoglycemia Renal panel reveals diabetes has started she is started on Metformin and she will return for glucometer and initiation of Lantus  Anxiety and depression Doing well at this time. No new meds at this time  GERD (gastroesophageal reflux disease) Using Omeprazole prn.   Allergic rhinitis Intermittent. Use antihistamines prn  Hemorrhoids Intermittent, use Witch Hazel Astringent prn, start a probiotic, increase hydration and fiber in diet  Enlarged thyroid [facility] and was told she had an enlarged thyroid, [place] of thyroid shows normal thyroid  Preventative health care Patient has not had pap since  2003. Agrees to return for pap and further care. Given Tdap   Psoriasis Triamcinolone prn

## 2011-12-26 NOTE — Telephone Encounter (Signed)
BS Tuesday night 228. Wed  5 am  197, 8 am 144 Wed PM 258 bedtime 276. This morning 160.  Patient states her sister n law told her to eat a piece of toast w/ peanut butter on it before bed so her numbers don't "bottom out"?  Please advise? Pt aware MD is off this AM and states [place] is [place] the beach so if she doesn't answer tomorrow leave a message.

## 2011-12-26 NOTE — Assessment & Plan Note (Signed)
Patient has not had pap since 2003. Agrees to return for pap and further care. Given Tdap

## 2011-12-26 NOTE — Assessment & Plan Note (Signed)
Intermittent. Use antihistamines prn

## 2011-12-26 NOTE — Assessment & Plan Note (Signed)
[facility] and was told she had an enlarged thyroid, [place] of thyroid shows normal thyroid

## 2011-12-26 NOTE — Assessment & Plan Note (Signed)
Using Omeprazole prn.

## 2011-12-26 NOTE — Assessment & Plan Note (Signed)
Intermittent, use Witch Hazel Astringent prn, start a probiotic, increase hydration and fiber in diet

## 2011-12-26 NOTE — Telephone Encounter (Signed)
No changes, continue current treatment and send [place] numbers again next week

## 2011-12-26 NOTE — Assessment & Plan Note (Signed)
Doing well at this time. No new meds at this time

## 2011-12-26 NOTE — Assessment & Plan Note (Signed)
Triamcinolone prn 

## 2011-12-26 NOTE — Telephone Encounter (Signed)
Left a detailed message on patients cell phone

## 2011-12-26 NOTE — Assessment & Plan Note (Signed)
Renal panel reveals diabetes has started she is started on Metformin and she will return for glucometer and initiation of Lantus

## 2011-12-30 MED ORDER — METRONIDAZOLE 500 MG PO TABS: 500.0000 mg | ORAL_TABLET | Freq: Two times a day (BID) | ORAL | Status: DC

## 2011-12-30 MED ORDER — FLUCONAZOLE 150 MG PO TABS: 150.0000 mg | ORAL_TABLET | ORAL | Status: DC

## 2011-12-30 NOTE — Progress Notes (Signed)
Addended by: Court Joy on: 12/30/2011 11:17 AM   Modules accepted: Orders

## 2012-01-01 ENCOUNTER — Telehealth: Payer: Self-pay

## 2012-01-01 NOTE — Telephone Encounter (Signed)
Pt informed

## 2012-01-01 NOTE — Telephone Encounter (Signed)
If she is taking it in pm, have her move it to the am and add 2 more units, let us know numbers again on 01/06/12

## 2012-01-01 NOTE — Telephone Encounter (Signed)
Pt wants to know if she should take the insulin before or after breakfast or does it matter? Please advise?

## 2012-01-01 NOTE — Telephone Encounter (Signed)
Patient was taking it at bedtime. Patient stated she will start taking it in the morning and start using 12 units. Pt also stated she has an appt Monday so she will just bring her numbers in with her. I will update med list

## 2012-01-01 NOTE — Telephone Encounter (Signed)
Either is fine as long as it is near breakfast and she is sure she is going to eat

## 2012-01-01 NOTE — Telephone Encounter (Signed)
Patient called with the following blood sugar results: Thursday- dinner 286 Friday- 208 (5 am), 174 (8am), dinner 257 Saturday- 148 am, dinner 243 Sunday- 152 am, dinner 239, bedtime 227 Monday- 136 (5am), 113 (8am), dinner 256, bedtime 247 Tuesday- 120 Wed- 113  Please advise? Patient is taking 10 units of her insulin

## 2012-01-03 ENCOUNTER — Other Ambulatory Visit: Payer: Self-pay | Admitting: Family Medicine

## 2012-01-03 DIAGNOSIS — Z1231 Encounter for screening mammogram for malignant neoplasm of breast: Secondary | ICD-10-CM

## 2012-01-06 ENCOUNTER — Encounter: Payer: Self-pay | Admitting: Family Medicine

## 2012-01-06 ENCOUNTER — Ambulatory Visit
Admission: RE | Admit: 2012-01-06 | Discharge: 2012-01-06 | Disposition: A | Discharge status: Home / Self Care | Payer: BC Managed Care – PPO | Source: Ambulatory Visit | Attending: Family Medicine | Admitting: Family Medicine

## 2012-01-06 ENCOUNTER — Ambulatory Visit (INDEPENDENT_AMBULATORY_CARE_PROVIDER_SITE_OTHER): Payer: BC Managed Care – PPO | Admitting: Family Medicine

## 2012-01-06 VITALS — BP 108/73 | HR 88 | Temp 98.9°F | Ht 62.5 in | Wt 125.4 lb

## 2012-01-06 DIAGNOSIS — E1165 Type 2 diabetes mellitus with hyperglycemia: Secondary | ICD-10-CM

## 2012-01-06 DIAGNOSIS — Z1231 Encounter for screening mammogram for malignant neoplasm of breast: Secondary | ICD-10-CM

## 2012-01-06 MED ORDER — BD LANCET ULTRAFINE 33G MISC: 1.0000 | Freq: Four times a day (QID) | Status: DC

## 2012-01-06 NOTE — Patient Instructions (Signed)
Diabetes and Foot Care Diabetes may cause you to have a poor blood supply (circulation) to your legs and feet. Because of this, the skin may be thinner, break easier, and heal more slowly. You also may have nerve damage in your legs and feet causing decreased feeling. You may not notice minor injuries to your feet that could lead to serious problems or infections. Taking care of your feet is one of the most important things you can do for yourself.  HOME CARE INSTRUCTIONS  Do not go barefoot. Bare feet are easily injured.   Check your feet daily for blisters, cuts, and redness.   Wash your feet with warm water (not hot) and mild soap. Pat your feet and between your toes until completely dry.   Apply a moisturizing lotion that does not contain alcohol or petroleum jelly to the dry skin on your feet and to dry brittle toenails. Do not put it between your toes.   Trim your toenails straight across. Do not dig under them or around the cuticle.   Do not cut corns or calluses, or try to remove them with medicine.   Wear clean cotton socks or stockings every day. Make sure they are not too tight. Do not wear knee high stockings since they may decrease blood flow to your legs.   Wear leather shoes that fit properly and have enough cushioning. To break in new shoes, wear them just a few hours a day to avoid injuring your feet.   Wear shoes at all times, even in the house.   Do not cross your legs. This may decrease the blood flow to your feet.   If you find a minor scrape, cut, or break in the skin on your feet, keep it and the skin around it clean and dry. These areas may be cleansed with mild soap and water. Do not use peroxide, alcohol, iodine or Merthiolate.   When you remove an adhesive bandage, be sure not to harm the skin around it.   If you have a wound, look at it several times a day to make sure it is healing.   Do not use heating pads or hot water bottles. Burns can occur. If you have  lost feeling in your feet or legs, you may not know it is happening until it is too late.   Report any cuts, sores or bruises to your caregiver. Do not wait!  SEEK MEDICAL CARE IF:   You have an injury that is not healing or you notice redness, numbness, burning, or tingling.   Your feet always feel cold.   You have pain or cramps in your legs and feet.  SEEK IMMEDIATE MEDICAL CARE IF:   There is increasing redness, swelling, or increasing pain in the wound.   There is a red line that goes up your leg.   Pus is coming from a wound.   You develop an unexplained oral temperature above 102 F (38.9 C), or as your caregiver suggests.   You notice a bad smell coming from an ulcer or wound.  MAKE SURE YOU:   Understand these instructions.   Will watch your condition.   Will get help right away if you are not doing well or get worse.  Document Released: 08/09/2000 Document Revised: 08/01/2011 Document Reviewed: 02/15/2009 [facility] Patient Information 2012 [place], [place].   Our current goal for sugars. Fasting 100 to 140,numbers greater than 140 this next week can increase to 14 units, if any numbers  below 140 stay at 12 units After eating sugars should be 120 to 150 roughly Call with any concers  Increase daily exercise

## 2012-01-08 ENCOUNTER — Telehealth: Payer: Self-pay

## 2012-01-08 NOTE — Telephone Encounter (Signed)
Just stick with 14 units unless she sees numbers under 100.

## 2012-01-08 NOTE — Telephone Encounter (Signed)
Fasting 100-140. MD asked pt to increase insulin to 14 if over 140 and stay at 12 units if under 140? Pt states her BS numbers Monday were 190, Tuesday am 130, 165 after dinner, and 175 pm, this morning 132 and pt took 14 units. Pt is wandering if she is supposed to follow the 14 units if over 140 and 12 units if under 140? Pt states in the morning its always under 140 but afternoon and evening are always higher? Please advise?

## 2012-01-08 NOTE — Telephone Encounter (Signed)
Patient informed and med list updated.

## 2012-01-12 ENCOUNTER — Encounter: Payer: Self-pay | Admitting: Family Medicine

## 2012-01-12 NOTE — Progress Notes (Signed)
Patient ID: Robin Barry, female   DOB: 05/27/1966, 46 y.o.   MRN: [id number] Robin Barry [id number] May 13, 1966 01/12/2012      Progress Note-Follow Up  Subjective  Chief Complaint  Chief Complaint  Patient presents with  . Follow-up    2 week f/ up on diabetes    HPI  Patient is a 46 Caucasian female who is in today for continued titration of her Lantus. She reports she's been feeling much better. No polyuria no polydipsia. Her head is more clear and her fatigue is improved. She's had no low sugar since moving her insulin to the morning. She is now using Lantus 12 units every morning. She is tolerating the metformin. Her sugar numbers have ranged from 84-208 with most of them being in the high 100s over the last several days. No chest pain, palpitations, shortness of breath or fevers.  Past Medical History  Diagnosis Date  . Chicken pox as a child  . Measles as a child  . Allergic rhinitis     ragweed  . GERD (gastroesophageal reflux disease)   . Hypoglycemia 2006  . Anxiety   . Depression   . H/O otitis media     childhood  . Anxiety and depression   . Hemorrhoids 12/26/2011  . Enlarged thyroid 12/26/2011  . Preventative health care 12/26/2011  . Psoriasis 12/26/2011  . Diabetes mellitus type 2, uncontrolled     Past Surgical History  Procedure Date  . Tubal ligation 1987    Family History  Problem Relation Age of Onset  . COPD Mother     smokes  . Leukemia Mother     chronic lymphatic  . Depression Mother   . Cancer Mother     skin, CLL  . Hypertension Father   . Cancer Father     prostate  . Diabetes Brother 20    Type 1  . Kidney disease Brother     dialysis  . Other Son     brain hemorrhage  . Cancer Maternal Grandmother     breast  . Alzheimer's disease Maternal Grandmother   . Cancer Maternal Grandfather     melanoma  . Cancer Paternal Grandmother     breast  . Emphysema Paternal Grandmother   . Alcohol abuse Paternal Grandfather   . Cancer  Paternal Grandfather     prostate    History   Social History  . Marital Status: Married    Spouse Name: N/A    Number of Children: N/A  . Years of Education: N/A   Occupational History  . Not on file.   Social History Main Topics  . Smoking status: Former Smoker -- 0.5 packs/day for 20 years    Types: Cigarettes    Quit date: 01/24/2010  . Smokeless tobacco: Never Used  . Alcohol Use: Yes     occasionally a little bit of wine  . Drug Use: No  . Sexually Active: Yes -- Female partner(s)   Other Topics Concern  . Not on file   Social History Narrative  . No narrative on file    Current Outpatient Prescriptions on File Prior to Visit  Medication Sig Dispense Refill  . cetirizine (ZYRTEC) 10 MG tablet Take 10 mg by mouth 2 (two) times daily.      . fluconazole (DIFLUCAN) 150 MG tablet Take 1 tablet (150 mg total) by mouth as directed. 1 tab once weekly X 2 weeks  2 tablet  0  . glucose blood test  strip 1 each by Other route 4 (four) times daily.  100 each  2  . insulin glargine (LANTUS) 100 UNIT/ML injection Inject 14 Units into the skin daily.       . Lancets (FREESTYLE) lancets 1 each by Other route 4 (four) times daily.  100 each  2  . lisinopril (PRINIVIL,ZESTRIL) 5 MG tablet Take 1 tablet (5 mg total) by mouth daily.  90 tablet  3  . metFORMIN (GLUCOPHAGE) 500 MG tablet Take 1 tablet (500 mg total) by mouth 2 (two) times daily with a meal.  60 tablet  1  . omeprazole (PRILOSEC) 10 MG capsule Take 10 mg by mouth daily.      . Triamcinolone Acetonide (TRIAMCINOLONE 0.1 % CREAM : EUCERIN) CREA Apply 1 application topically 2 (two) times daily.        Allergies  Allergen Reactions  . Bactrim     rash  . Cephalexin     rash  . Codeine     hallucinations  . Terramycin     Review of Systems  Review of Systems  Constitutional: Positive for malaise/fatigue. Negative for fever.  HENT: Negative for congestion.   Eyes: Negative for discharge.  Respiratory: Negative  for shortness of breath.   Cardiovascular: Negative for chest pain, palpitations and leg swelling.  Gastrointestinal: Negative for nausea, abdominal pain and diarrhea.  Genitourinary: Negative for dysuria.  Musculoskeletal: Negative for falls.  Skin: Negative for rash.  Neurological: Negative for loss of consciousness and headaches.  Endo/Heme/Allergies: Negative for polydipsia.  Psychiatric/Behavioral: Negative for depression and suicidal ideas. The patient is not nervous/anxious and does not have insomnia.     Objective  BP 108/73  Pulse 88  Temp(Src) 98.9 F (37.2 C) (Temporal)  Ht 5' 2.5" (1.588 m)  Wt 125 lb 6.4 oz (56.881 kg)  BMI 22.57 kg/m2  SpO2 100%  LMP 12/21/2011  Physical Exam  Physical Exam  Constitutional: She is oriented to person, place, and time and well-developed, well-nourished, and in no distress. No distress.  HENT:  Head: Normocephalic and atraumatic.  Eyes: Conjunctivae are normal.  Neck: Neck supple. No thyromegaly present.  Cardiovascular: Normal rate, regular rhythm and normal heart sounds.   No murmur heard. Pulmonary/Chest: Effort normal and breath sounds normal. She has no wheezes.  Abdominal: She exhibits no distension and no mass.  Musculoskeletal: She exhibits no edema.  Lymphadenopathy:    She has no cervical adenopathy.  Neurological: She is alert and oriented to person, place, and time.  Skin: Skin is warm and dry. No rash noted. She is not diaphoretic.  Psychiatric: Memory, affect and judgment normal.    Lab Results  Component Value Date   TSH 0.46 12/19/2011   Lab Results  Component Value Date   WBC 7.0 12/19/2011   HGB 13.5 12/19/2011   HCT 40.6 12/19/2011   MCV 90.8 12/19/2011   PLT 350.0 12/19/2011   Lab Results  Component Value Date   CREATININE 0.7 12/19/2011   BUN 7 12/19/2011   NA 136 12/19/2011   K 3.4* 12/19/2011   CL 101 12/19/2011   CO2 25 12/19/2011   Lab Results  Component Value Date   ALT 14 12/19/2011   AST 15  12/19/2011   ALKPHOS 82 12/19/2011   BILITOT 0.2* 12/19/2011   Lab Results  Component Value Date   CHOL 220* 12/19/2011   Lab Results  Component Value Date   HDL 47.90 12/19/2011   No results found for this basename: LDLCALC  Lab Results  Component Value Date   TRIG 118.0 12/19/2011   Lab Results  Component Value Date   CHOLHDL 5 12/19/2011     Assessment & Plan  Diabetes mellitus type 2, uncontrolled Patient is doing much better since her sugars have started to come down. She reports increase energy less malaise. She is now using 12 units of Lantus in the morning and has not experienced any low sugars. She brings in her log which she'll that her high sugar in the last several days has been to wait and her lowest is an 84. She is encouraged to maintain a good healthy diabetic diet not to skip meals

## 2012-01-12 NOTE — Assessment & Plan Note (Signed)
Patient is doing much better since her sugars have started to come down. She reports increase energy less malaise. She is now using 12 units of Lantus in the morning and has not experienced any low sugars. She brings in her log which she'll that her high sugar in the last several days has been to wait and her lowest is an 84. She is encouraged to maintain a good healthy diabetic diet not to skip meals

## 2012-01-27 ENCOUNTER — Encounter: Payer: Self-pay | Admitting: *Deleted

## 2012-01-27 ENCOUNTER — Encounter: Payer: BC Managed Care – PPO | Attending: Family Medicine | Admitting: *Deleted

## 2012-01-27 VITALS — Ht 62.5 in | Wt 123.0 lb

## 2012-01-27 DIAGNOSIS — Z713 Dietary counseling and surveillance: Secondary | ICD-10-CM | POA: Insufficient documentation

## 2012-01-27 DIAGNOSIS — E119 Type 2 diabetes mellitus without complications: Secondary | ICD-10-CM | POA: Insufficient documentation

## 2012-01-27 DIAGNOSIS — E1165 Type 2 diabetes mellitus with hyperglycemia: Secondary | ICD-10-CM

## 2012-01-27 NOTE — Patient Instructions (Signed)
Diabetes Self-Care  Feet  Daily inspection of feet (look for red/white spots, signs of infection, blisters, dryness)  Dryness of skin: Bathe daily and use lotion containing lanolin  Dry/Callus Areas: Use pumice stone to remove callus tissue  Diabetic socks (Thick, mixed cotton/acrylic)  Diabetic shoes (Need MD prescription, Check with insurance for coverage)  Eyes  Yearly dilated eye exam  Teeth and Mouth  Brush and Floss Daily  Cleaning/Exam every 6 months  Monitoring Blood Glucose  Fasting glucose (80-120)  Pre-Meal (80-120)  2 hrs after the first bite of a meal (80-160)  Bedtime (100-140)  HgbA1C  Check every 3-6 months per MD  Goal <7%  Medical Care American Diabetes Association recommends MD visits every 3-6 months  Exercise  Aerobic exercise (walking, biking, hiking, swimming)  Every day  For 30 min  Nutrition  Carbohydrates  Convert to glucose  Consume carbohydrates as a part of meals and snacks  Avoid the use of concentrated sweets as they promote the rapid rise in blood glucose (sweet tea, juice, regular soda, and sweetened beverages)  Choose sugar substitutes for sweetening  Fiber  Slow the uptake of glucose from carbohydrate  Fibrous foods include whole grain breads, cereals, vegetables, beans, peas, and lentils  Use food label to determine foods highest in fiber  Breads should have >2 grams per serving  Cereals should have >3 grams per serving  Sugar  Aim for 0-9 grams per serving  Choose your use of foods containing higher sugar levels wisely  Counting carbohydrates  Breakfast =  45 grams carbohydrate (3 carb choices)  AM Snack = 15 grams carbohydrate + protein (1 carb choices)  Lunch =  45 grams carbohydrate (1 carb choices)  PM Snack = 15 grams carbohydrate + protein (1 carb choices)  Dinner =  45 grams carbohydrate (1 carb choices)  Bedtime Snack (as needed) = 15 grams carbohydrate + protein (1 carb  choices)  OR   Plate Method for Carbohydrate Control  Use an 8 inch plate  Make 1/2 plate non-starchy vegetables  Make 1/4 plate carbohydrate choice (high fiber)  Make 1/4 plate lean protein  Non-Starchy Vegetables  High in fiber and low in starches/calories  Try to have a large serving a lunch/dinner  Considered a "FREE" choice  High in vitamins, minerals, and anti-oxidants  Deep rich colored vegetables  Proteins  Build and repair tissues and do not contain glucose  Watch for added carbohydrate to protein (such as gravy, breading, sauces)  Choose lean proteins  Bake, broil, grill, and roast proteins,   Serving size = Size of the palm of the hand  Have a protein source at all meals and snacks  Fats  Keep at 1-2 servings per meal  Measure fat servings  Choose reduced fat or low-fat varieties of salad dressings and condiments  Use food label for serving sizes

## 2012-01-28 NOTE — Progress Notes (Signed)
  Medical Nutrition Therapy:  Appt start time: 1145  End time:  1245.  Assessment:  T2DM, New Onset.  Robin Barry is here today for assessment with an A1c of 14.7% (12/19/11). Reports her sister-in-law is a CDE in [place] and has been helping her since diagnosis in April.  Pt states she has her on 1200-1400 cal diet with 45 grams of CHO/meal and 15g at snacks, with which I agree. Is knowledgeable of physiology of DM and CHO counting. States what she really needs is menus and help putting meals together that fit within guidelines. This was focus of discussion today.    Last reported A1c: 14.7% CBG monitoring: 3-4 times daily  Average CBG per patient:  Fasting: 94-101 mg,  1 hr PPG: 125-130, 145 mg  MEDICATIONS: See medication list; Reconciled with pt.   Usual physical activity: Walks 1-2 miles/day or Wii Fit for 30-45 min   Estimated energy needs: 1200-1400 calories 135-160 g carbohydrates 90-95 g protein 35-40 g fat  Progress Towards Goal(s):  In progress.   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    Intervention:  Nutrition education/Diabetes Self-Care Goals (see pt instructions).  Handouts given during visit include:  Living Well with Diabetes - [employer]  45g CHO meal plans  [employer] - BD  Meal Planning Card  Monitoring/Evaluation:  Dietary intake, exercise, A1c, BG trends, and body weight in 3 month(s).

## 2012-01-29 ENCOUNTER — Ambulatory Visit: Payer: BC Managed Care – PPO | Admitting: *Deleted

## 2012-02-03 ENCOUNTER — Ambulatory Visit (INDEPENDENT_AMBULATORY_CARE_PROVIDER_SITE_OTHER): Payer: BC Managed Care – PPO | Admitting: Family Medicine

## 2012-02-03 ENCOUNTER — Encounter: Payer: Self-pay | Admitting: Family Medicine

## 2012-02-03 VITALS — BP 119/82 | HR 81 | Temp 98.4°F | Ht 60.25 in | Wt 126.4 lb

## 2012-02-03 DIAGNOSIS — E1165 Type 2 diabetes mellitus with hyperglycemia: Secondary | ICD-10-CM

## 2012-02-03 DIAGNOSIS — J309 Allergic rhinitis, unspecified: Secondary | ICD-10-CM

## 2012-02-03 DIAGNOSIS — K219 Gastro-esophageal reflux disease without esophagitis: Secondary | ICD-10-CM

## 2012-02-03 MED ORDER — LISINOPRIL 5 MG PO TABS: 5.0000 mg | ORAL_TABLET | Freq: Every day | ORAL | Status: DC

## 2012-02-03 MED ORDER — METFORMIN HCL 500 MG PO TABS: 500.0000 mg | ORAL_TABLET | Freq: Two times a day (BID) | ORAL | Status: DC

## 2012-02-03 MED ORDER — INSULIN PEN NEEDLE 32G X 4 MM MISC: Status: DC

## 2012-02-03 NOTE — Patient Instructions (Signed)
Cholesterol Control Diet  Cholesterol levels in your body are determined significantly by your diet. Cholesterol levels may also be related to heart disease. The following material helps to explain this relationship and discusses what you can do to help keep your heart healthy. Not all cholesterol is bad. Low-density lipoprotein (LDL) cholesterol is the "bad" cholesterol. It may cause fatty deposits to build up inside your arteries. High-density lipoprotein (HDL) cholesterol is "good." It helps to remove the "bad" LDL cholesterol from your blood. Cholesterol is a very important risk factor for heart disease. Other risk factors are high blood pressure, smoking, stress, heredity, and weight.  The heart muscle gets its supply of blood through the coronary arteries. If your LDL cholesterol is high and your HDL cholesterol is low, you are at risk for having fatty deposits build up in your coronary arteries. This leaves less room through which blood can flow. Without sufficient blood and oxygen, the heart muscle cannot function properly and you may feel chest pains (angina pectoris). When a coronary artery closes up entirely, a part of the heart muscle may die, causing a heart attack (myocardial infarction).  CHECKING CHOLESTEROL  When your caregiver sends your blood to a lab to be analyzed for cholesterol, a complete lipid (fat) profile may be done. With this test, the total amount of cholesterol and levels of LDL and HDL are determined. Triglycerides are a type of fat that circulates in the blood and can also be used to determine heart disease risk. The list below describes what the numbers should be:  Test: Total Cholesterol.   Less than 200 mg/dl.   Test: LDL "bad cholesterol."   Less than 100 mg/dl.    Less than 70 mg/dl if you are at very high risk of a heart attack or sudden cardiac death.   Test: HDL "good cholesterol."   Greater than 50 mg/dl for women.    Greater than 40 mg/dl for men.    Test: Triglycerides.   Less than 150 mg/dl.   CONTROLLING CHOLESTEROL WITH DIET  Although exercise and lifestyle factors are important, your diet is key. That is because certain foods are known to raise cholesterol and others to lower it. The goal is to balance foods for their effect on cholesterol and more importantly, to replace saturated and trans fat with other types of fat, such as monounsaturated fat, polyunsaturated fat, and omega-3 fatty acids.  On average, a person should consume no more than 15 to 17 g of saturated fat daily. Saturated and trans fats are considered "bad" fats, and they will raise LDL cholesterol. Saturated fats are primarily found in animal products such as meats, butter, and cream. However, that does not mean you need to sacrifice all your favorite foods. Today, there are good tasting, low-fat, low-cholesterol substitutes for most of the things you like to eat. Choose low-fat or nonfat alternatives. Choose round or loin cuts of red meat, since these types of cuts are lowest in fat and cholesterol. Chicken (without the skin), fish, veal, and ground turkey breast are excellent choices. Eliminate fatty meats, such as hot dogs and salami. Even shellfish have little or no saturated fat. Have a 3 oz (85 g) portion when you eat lean meat, poultry, or fish.  Trans fats are also called "partially hydrogenated oils." They are oils that have been scientifically manipulated so that they are solid at room temperature resulting in a longer shelf life and improved taste and texture of foods in which they are   added. Trans fats are found in stick margarine, some tub margarines, cookies, crackers, and baked goods.    When baking and cooking, oils are an excellent substitute for butter. The monounsaturated oils are especially beneficial since it is believed they lower LDL and raise HDL. The oils you should avoid entirely are saturated tropical oils, such as coconut and palm.     Remember to eat liberally from food groups that are naturally free of saturated and trans fat, including fish, fruit, vegetables, beans, grains (barley, rice, couscous, bulgur wheat), and pasta (without cream sauces).    IDENTIFYING FOODS THAT LOWER CHOLESTEROL    Soluble fiber may lower your cholesterol. This type of fiber is found in fruits such as apples, vegetables such as broccoli, potatoes, and carrots, legumes such as beans, peas, and lentils, and grains such as barley. Foods fortified with plant sterols (phytosterol) may also lower cholesterol. You should eat at least 2 g per day of these foods for a cholesterol lowering effect.    Read package labels to identify low-saturated fats, trans fats free, and low-fat foods at the supermarket. Select cheeses that have only 2 to 3 g saturated fat per ounce. Use a heart-healthy tub margarine that is free of trans fats or partially hydrogenated oil. When buying baked goods (cookies, crackers), avoid partially hydrogenated oils. Breads and muffins should be made from whole grains (whole-wheat or whole oat flour, instead of "flour" or "enriched flour"). Buy non-creamy canned soups with reduced salt and no added fats.    FOOD PREPARATION TECHNIQUES    Never deep-fry. If you must fry, either stir-fry, which uses very little fat, or use non-stick cooking sprays. When possible, broil, bake, or roast meats, and steam vegetables. Instead of dressing vegetables with butter or margarine, use lemon and herbs, applesauce and cinnamon (for squash and sweet potatoes), nonfat yogurt, salsa, and low-fat dressings for salads.    LOW-SATURATED FAT / LOW-FAT FOOD SUBSTITUTES  Meats / Saturated Fat (g)   Avoid: Steak, marbled (3 oz/85 g) / 11 g    Choose: Steak, lean (3 oz/85 g) / 4 g    Avoid: Hamburger (3 oz/85 g) / 7 g    Choose: Hamburger, lean (3 oz/85 g) / 5 g    Avoid: Ham (3 oz/85 g) / 6 g    Choose: Ham, lean cut (3 oz/85 g) / 2.4 g     Avoid: Chicken, with skin, dark meat (3 oz/85 g) / 4 g    Choose: Chicken, skin removed, dark meat (3 oz/85 g) / 2 g    Avoid: Chicken, with skin, light meat (3 oz/85 g) / 2.5 g    Choose: Chicken, skin removed, light meat (3 oz/85 g) / 1 g   Dairy / Saturated Fat (g)   Avoid: Whole milk (1 cup) / 5 g    Choose: Low-fat milk, 2% (1 cup) / 3 g    Choose: Low-fat milk, 1% (1 cup) / 1.5 g    Choose: Skim milk (1 cup) / 0.3 g    Avoid: Hard cheese (1 oz/28 g) / 6 g    Choose: Skim milk cheese (1 oz/28 g) / 2 to 3 g    Avoid: Cottage cheese, 4% fat (1 cup) / 6.5 g    Choose: Low-fat cottage cheese, 1% fat (1 cup) / 1.5 g    Avoid: Ice cream (1 cup) / 9 g    Choose: Sherbet (1 cup) / 2.5 g      Choose: Nonfat frozen yogurt (1 cup) / 0.3 g    Choose: Frozen fruit bar / trace    Avoid: Whipped cream (1 tbs) / 3.5 g    Choose: Nondairy whipped topping (1 tbs) / 1 g   Condiments / Saturated Fat (g)   Avoid: Mayonnaise (1 tbs) / 2 g    Choose: Low-fat mayonnaise (1 tbs) / 1 g    Avoid: Butter (1 tbs) / 7 g    Choose: Extra light margarine (1 tbs) / 1 g    Avoid: Coconut oil (1 tbs) / 11.8 g    Choose: Olive oil (1 tbs) / 1.8 g    Choose: Corn oil (1 tbs) / 1.7 g    Choose: Safflower oil (1 tbs) / 1.2 g    Choose: Sunflower oil (1 tbs) / 1.4 g    Choose: Soybean oil (1 tbs) / 2.4 g    Choose: Canola oil (1 tbs) / 1 g   Document Released: 08/12/2005 Document Revised: 08/01/2011 Document Reviewed: 01/31/2011  [employer] Patient Information 2012 [employer]

## 2012-02-06 NOTE — Assessment & Plan Note (Signed)
Uses Zyrtec as needed 

## 2012-02-06 NOTE — Progress Notes (Signed)
Patient ID: Robin Barry, female   DOB: 03-10-1966, 46 y.o.   MRN: [id number] Robin Barry [id number] 09-Apr-1966 02/06/2012      Progress Note-Follow Up  Subjective  Chief Complaint  Chief Complaint  Patient presents with  . Follow-up    1 month    HPI  Patient is a 46 year old Caucasian female who is in today for followup. She is very pleased with the improvement in her blood sugars and is noting vast majority of her fasting sugars are in the 90s to a high of 110. She takes generally 12 units of Lantus and if her blood sugars above 120 she takes 14 but that is rare. She denies any polyuria or polydipsia. Her fatigue is improved. No recent illness, fevers, chills, chest pain, palpitations, shortness of breath, GI or GU complaints.  Past Medical History  Diagnosis Date  . Chicken pox as a child  . Measles as a child  . Allergic rhinitis     ragweed  . GERD (gastroesophageal reflux disease)   . Hypoglycemia 2006  . Anxiety   . Depression   . H/O otitis media     childhood  . Anxiety and depression   . Hemorrhoids 12/26/2011  . Enlarged thyroid 12/26/2011  . Preventative health care 12/26/2011  . Psoriasis 12/26/2011  . Diabetes mellitus type 2, uncontrolled     Past Surgical History  Procedure Date  . Tubal ligation 1987    Family History  Problem Relation Age of Onset  . COPD Mother     smokes  . Leukemia Mother     chronic lymphatic  . Depression Mother   . Cancer Mother     skin, CLL  . Hypertension Father   . Cancer Father     prostate  . Diabetes Brother 20    Type 1  . Kidney disease Brother     dialysis  . Other Son     brain hemorrhage  . Cancer Maternal Grandmother     breast  . Alzheimer's disease Maternal Grandmother   . Cancer Maternal Grandfather     melanoma  . Cancer Paternal Grandmother     breast  . Emphysema Paternal Grandmother   . Alcohol abuse Paternal Grandfather   . Cancer Paternal Grandfather     prostate    History    Social History  . Marital Status: Married    Spouse Name: N/A    Number of Children: N/A  . Years of Education: N/A   Occupational History  . Not on file.   Social History Main Topics  . Smoking status: Former Smoker -- 0.5 packs/day for 20 years    Types: Cigarettes    Quit date: 01/24/2010  . Smokeless tobacco: Never Used  . Alcohol Use: Yes     occasionally a little bit of wine  . Drug Use: No  . Sexually Active: Yes -- Female partner(s)   Other Topics Concern  . Not on file   Social History Narrative  . No narrative on file    Current Outpatient Prescriptions on File Prior to Visit  Medication Sig Dispense Refill  . cetirizine (ZYRTEC) 10 MG tablet Take 10 mg by mouth 2 (two) times daily.      Marland Kitchen glucose blood test strip 1 each by Other route 4 (four) times daily.  100 each  2  . insulin glargine (LANTUS) 100 UNIT/ML injection Inject 12 Units into the skin daily.       . Lancets (FREESTYLE)  lancets 1 each by Other route 4 (four) times daily.  100 each  2  . lisinopril (PRINIVIL,ZESTRIL) 5 MG tablet Take 1 tablet (5 mg total) by mouth daily.  90 tablet  3  . metFORMIN (GLUCOPHAGE) 500 MG tablet Take 1 tablet (500 mg total) by mouth 2 (two) times daily with a meal.  180 tablet  3  . omeprazole (PRILOSEC) 10 MG capsule Take 10 mg by mouth daily.      . Probiotic Product (PROBIOTIC PEARLS ADVANTAGE) CAPS Take 1 capsule by mouth daily.        Allergies  Allergen Reactions  . Bactrim     rash  . Cephalexin     rash  . Codeine     hallucinations  . Terramycin     Review of Systems  Review of Systems  Constitutional: Negative for fever and malaise/fatigue.  HENT: Negative for congestion.   Eyes: Negative for discharge.  Respiratory: Negative for shortness of breath.   Cardiovascular: Negative for chest pain, palpitations and leg swelling.  Gastrointestinal: Negative for nausea, abdominal pain and diarrhea.  Genitourinary: Negative for dysuria.  Musculoskeletal:  Negative for falls.  Skin: Negative for rash.  Neurological: Negative for loss of consciousness and headaches.  Endo/Heme/Allergies: Negative for polydipsia.  Psychiatric/Behavioral: Negative for depression and suicidal ideas. The patient is not nervous/anxious and does not have insomnia.     Objective  BP 119/82  Pulse 81  Temp 98.4 F (36.9 C) (Temporal)  Ht 5' 0.25" (1.53 m)  Wt 126 lb 6.4 oz (57.335 kg)  BMI 24.48 kg/m2  SpO2 100%  LMP 01/19/2012  Physical Exam  Physical Exam  Constitutional: She is oriented to person, place, and time and well-developed, well-nourished, and in no distress. No distress.  HENT:  Head: Normocephalic and atraumatic.  Eyes: Conjunctivae are normal.  Neck: Neck supple. No thyromegaly present.  Cardiovascular: Normal rate, regular rhythm and normal heart sounds.   No murmur heard. Pulmonary/Chest: Effort normal and breath sounds normal. She has no wheezes.  Abdominal: She exhibits no distension and no mass.  Musculoskeletal: She exhibits no edema.  Lymphadenopathy:    She has no cervical adenopathy.  Neurological: She is alert and oriented to person, place, and time.  Skin: Skin is warm and dry. No rash noted. She is not diaphoretic.  Psychiatric: Memory, affect and judgment normal.    Lab Results  Component Value Date   TSH 0.46 12/19/2011   Lab Results  Component Value Date   WBC 7.0 12/19/2011   HGB 13.5 12/19/2011   HCT 40.6 12/19/2011   MCV 90.8 12/19/2011   PLT 350.0 12/19/2011   Lab Results  Component Value Date   CREATININE 0.7 12/19/2011   BUN 7 12/19/2011   NA 136 12/19/2011   K 3.4* 12/19/2011   CL 101 12/19/2011   CO2 25 12/19/2011   Lab Results  Component Value Date   ALT 14 12/19/2011   AST 15 12/19/2011   ALKPHOS 82 12/19/2011   BILITOT 0.2* 12/19/2011   Lab Results  Component Value Date   CHOL 220* 12/19/2011   Lab Results  Component Value Date   HDL 47.90 12/19/2011   No results found for this basename: [facility]     Lab Results  Component Value Date   TRIG 118.0 12/19/2011   Lab Results  Component Value Date   CHOLHDL 5 12/19/2011     Assessment & Plan  Diabetes mellitus type 2, uncontrolled Improved with Lantus at 12  units most days and occasionally 14 units if her numbers are running hi, avoid simple carbs and monitor sugars.  Allergic rhinitis Uses Zyrtec as needed.   GERD (gastroesophageal reflux disease) Well controlled on Omeprazole, avoid offending foods.

## 2012-02-06 NOTE — Assessment & Plan Note (Signed)
Improved with Lantus at 12 units most days and occasionally 14 units if her numbers are running hi, avoid simple carbs and monitor sugars.

## 2012-02-06 NOTE — Assessment & Plan Note (Signed)
Well controlled on Omeprazole, avoid offending foods 

## 2012-02-18 ENCOUNTER — Ambulatory Visit: Payer: BC Managed Care – PPO | Admitting: Family Medicine

## 2012-03-16 ENCOUNTER — Ambulatory Visit: Payer: BC Managed Care – PPO

## 2012-03-16 DIAGNOSIS — E119 Type 2 diabetes mellitus without complications: Secondary | ICD-10-CM

## 2012-03-16 DIAGNOSIS — E049 Nontoxic goiter, unspecified: Secondary | ICD-10-CM

## 2012-03-16 LAB — RENAL FUNCTION PANEL
CO2: 27 mEq/L (ref 19–32)
Calcium: 8.9 mg/dL (ref 8.4–10.5)
Chloride: 105 mEq/L (ref 96–112)
Creatinine, Ser: 0.6 mg/dL (ref 0.4–1.2)
GFR: 120.49 mL/min (ref >60.00)
Sodium: 140 mEq/L (ref 135–145)

## 2012-03-16 LAB — HEMOGLOBIN A1C: Hgb A1c MFr Bld: 7.9 % — ABNORMAL HIGH (ref 4.6–6.5)

## 2012-03-16 LAB — LIPID PANEL
Cholesterol: 174 mg/dL (ref 0–200)
HDL: 50.4 mg/dL (ref >39.00)
LDL Cholesterol: 106 mg/dL — ABNORMAL HIGH (ref 0–99)
VLDL: 18 mg/dL (ref 0.0–40.0)

## 2012-03-16 LAB — CBC
HCT: 36.3 % (ref 36.0–46.0)
Hemoglobin: 12 g/dL (ref 12.0–15.0)
MCHC: 33.1 g/dL (ref 30.0–36.0)
MCV: 91.1 fl (ref 78.0–100.0)
RDW: 14.5 % (ref 11.5–14.6)

## 2012-03-16 LAB — HEPATIC FUNCTION PANEL
AST: 12 U/L (ref 0–37)
Albumin: 3.8 g/dL (ref 3.5–5.2)
Alkaline Phosphatase: 40 U/L (ref 39–117)
Total Bilirubin: 0.4 mg/dL (ref 0.3–1.2)

## 2012-03-23 ENCOUNTER — Encounter: Payer: Self-pay | Admitting: Family Medicine

## 2012-03-23 ENCOUNTER — Ambulatory Visit (INDEPENDENT_AMBULATORY_CARE_PROVIDER_SITE_OTHER): Payer: BC Managed Care – PPO | Admitting: Family Medicine

## 2012-03-23 VITALS — BP 116/78 | HR 73 | Temp 98.1°F | Resp 16 | Ht 63.0 in | Wt 126.5 lb

## 2012-03-23 DIAGNOSIS — F411 Generalized anxiety disorder: Secondary | ICD-10-CM

## 2012-03-23 DIAGNOSIS — E119 Type 2 diabetes mellitus without complications: Secondary | ICD-10-CM

## 2012-03-23 DIAGNOSIS — G4700 Insomnia, unspecified: Secondary | ICD-10-CM

## 2012-03-23 DIAGNOSIS — F341 Dysthymic disorder: Secondary | ICD-10-CM

## 2012-03-23 DIAGNOSIS — K219 Gastro-esophageal reflux disease without esophagitis: Secondary | ICD-10-CM

## 2012-03-23 DIAGNOSIS — E1165 Type 2 diabetes mellitus with hyperglycemia: Secondary | ICD-10-CM

## 2012-03-23 DIAGNOSIS — F419 Anxiety disorder, unspecified: Secondary | ICD-10-CM

## 2012-03-23 DIAGNOSIS — E785 Hyperlipidemia, unspecified: Secondary | ICD-10-CM | POA: Insufficient documentation

## 2012-03-23 HISTORY — DX: Hyperlipidemia, unspecified: E78.5

## 2012-03-23 HISTORY — DX: Insomnia, unspecified: G47.00

## 2012-03-23 MED ORDER — ALPRAZOLAM 0.25 MG PO TABS: 0.2500 mg | ORAL_TABLET | Freq: Two times a day (BID) | ORAL | Status: DC | PRN

## 2012-03-23 MED ORDER — GLUCOSE BLOOD VI STRP: 1.0000 | ORAL_STRIP | Freq: Four times a day (QID) | Status: DC

## 2012-03-23 MED ORDER — FREESTYLE LANCETS MISC: 1.0000 | Freq: Four times a day (QID) | Status: DC

## 2012-03-23 NOTE — Assessment & Plan Note (Signed)
Declines daily meds but is struggling with increased stress with sick family members etc. She has used Alprazolam intermittently in past with good results, is given an rx to use sparingly and she is asked to return if symptoms worsen and she needs a daily medication

## 2012-03-23 NOTE — Assessment & Plan Note (Signed)
Discussed sleep hygiene and may use Alprazolam prn

## 2012-03-23 NOTE — Patient Instructions (Addendum)

## 2012-03-23 NOTE — Assessment & Plan Note (Signed)
Good improvement with dietary and exercise changes. Has started MegaRed krill oil continue the same, avoid trans fats and increase exercise, add a vitamin B complex. Recheck in 3 months

## 2012-03-23 NOTE — Assessment & Plan Note (Signed)
Improved with dietary changes.

## 2012-03-23 NOTE — Assessment & Plan Note (Signed)
Doing much better her hgba1c has dropped from 14.7 to 7.9. Has occasional (once a week sugar) over 200 in evening but has seen 82 in evening as well. Morning numbers are 90 to 120. She takes Lantus 12 units daily most days but if her pm sugar has been above 150 she will increase to 14 units just for that day. She is encouraged to continue with her dietary changes and increase exercise no other changes to meds at this time, she will call if numbers worsen

## 2012-03-23 NOTE — Progress Notes (Signed)
Patient ID: Robin Barry, female   DOB: 1966/04/04, 46 y.o.   MRN: [id number] Terrence Pizana [id number] 12-Oct-1965 03/23/2012      Progress Note-Follow Up  Subjective  Chief Complaint  Chief Complaint  Patient presents with  . Follow-up    6-wk; pt to discuss Rx for Anxiety & depression.  . Medication Refill    Pt requesting 90-day refills: lancetrs & test strips; pending.    HPI  Patient is a 46 year old Caucasian female who is in today for followup. She is making tremendous changes in her diet and lifestyle and her blood sugars are greatly improved. She feels better and on first no physical acute complaints at this time. Her blood sugars in the morning range from 90-120 in the evenings generally range between 120 to 150. In the last month she has had 1 sugar of 82 and roughly once a week will have a blood sugar above 200 in the evening. No polyuria or polydipsia. She still struggling with fatigue but notes that is mostly due to secondary insomnia. She has had a lot of stress with sick family members and believes that is affecting her sleep somewhat. Overall she is avoiding transplant and minimizing carbs. She's not had any recent fevers, chills, chest pain, palpitations, shortness of breath, GI or GU complaints. Her heartburn is improved with dietary changes  Past Medical History  Diagnosis Date  . Chicken pox as a child  . Measles as a child  . Allergic rhinitis     ragweed  . GERD (gastroesophageal reflux disease)   . Hypoglycemia 2006  . Anxiety   . Depression   . H/O otitis media     childhood  . Anxiety and depression   . Hemorrhoids 12/26/2011  . Enlarged thyroid 12/26/2011  . Preventative health care 12/26/2011  . Psoriasis 12/26/2011  . Diabetes mellitus type 2, uncontrolled   . Hyperlipidemia 03/23/2012  . Insomnia 03/23/2012    Past Surgical History  Procedure Date  . Tubal ligation 1987    Family History  Problem Relation Age of Onset  . COPD Mother     smokes    . Leukemia Mother     chronic lymphatic  . Depression Mother   . Cancer Mother     skin, CLL  . Hypertension Father   . Cancer Father     prostate  . Diabetes Brother 20    Type 1  . Kidney disease Brother     dialysis  . Other Son     brain hemorrhage  . Cancer Maternal Grandmother     breast  . Alzheimer's disease Maternal Grandmother   . Cancer Maternal Grandfather     melanoma  . Cancer Paternal Grandmother     breast  . Emphysema Paternal Grandmother   . Alcohol abuse Paternal Grandfather   . Cancer Paternal Grandfather     prostate  . Other Maternal Aunt     thalessemia  . Other Maternal Aunt     thalesemia   . Cancer Maternal Aunt     lung  . Other Maternal Aunt     thalesemmia    History   Social History  . Marital Status: Married    Spouse Name: N/A    Number of Children: N/A  . Years of Education: N/A   Occupational History  . Not on file.   Social History Main Topics  . Smoking status: Former Smoker -- 0.5 packs/day for 20 years    Types: Cigarettes  Quit date: 01/24/2010  . Smokeless tobacco: Never Used  . Alcohol Use: Yes     occasionally a little bit of wine  . Drug Use: No  . Sexually Active: Yes -- Female partner(s)   Other Topics Concern  . Not on file   Social History Narrative  . No narrative on file    Current Outpatient Prescriptions on File Prior to Visit  Medication Sig Dispense Refill  . cetirizine (ZYRTEC) 10 MG tablet Take 10 mg by mouth 2 (two) times daily.      . insulin glargine (LANTUS) 100 UNIT/ML injection Inject 12 Units into the skin daily.       . Insulin Pen Needle (BD PEN NEEDLE NANO U/F) 32G X 4 MM MISC As directed  100 each  2  . lisinopril (PRINIVIL,ZESTRIL) 5 MG tablet Take 1 tablet (5 mg total) by mouth daily.  90 tablet  3  . metFORMIN (GLUCOPHAGE) 500 MG tablet Take 1 tablet (500 mg total) by mouth 2 (two) times daily with a meal.  180 tablet  3  . omeprazole (PRILOSEC) 10 MG capsule Take 10 mg by  mouth daily.      . Probiotic Product (PROBIOTIC PEARLS ADVANTAGE) CAPS Take 1 capsule by mouth daily.        Allergies  Allergen Reactions  . Bactrim     rash  . Cephalexin     rash  . Codeine     hallucinations  . Terramycin     Review of Systems  Review of Systems  Constitutional: Negative for fever and malaise/fatigue.  HENT: Negative for congestion.   Eyes: Negative for discharge.  Respiratory: Negative for shortness of breath.   Cardiovascular: Negative for chest pain, palpitations and leg swelling.  Gastrointestinal: Negative for nausea, abdominal pain and diarrhea.  Genitourinary: Negative for dysuria.  Musculoskeletal: Negative for falls.  Skin: Negative for rash.  Neurological: Negative for loss of consciousness and headaches.  Endo/Heme/Allergies: Negative for polydipsia.  Psychiatric/Behavioral: Negative for depression and suicidal ideas. The patient is nervous/anxious and has insomnia.     Objective  BP 116/78  Pulse 73  Temp 98.1 F (36.7 C) (Oral)  Resp 16  Ht 5\' 3"  (1.6 m)  Wt 126 lb 8 oz (57.38 kg)  BMI 22.41 kg/m2  SpO2 97%  LMP 03/15/2012  Physical Exam  Physical Exam  Constitutional: She is oriented to person, place, and time and well-developed, well-nourished, and in no distress. No distress.  HENT:  Head: Normocephalic and atraumatic.  Eyes: Conjunctivae are normal.  Neck: Neck supple. No thyromegaly present.  Cardiovascular: Normal rate, regular rhythm and normal heart sounds.   No murmur heard. Pulmonary/Chest: Effort normal and breath sounds normal. She has no wheezes.  Abdominal: She exhibits no distension and no mass.  Musculoskeletal: She exhibits no edema.  Lymphadenopathy:    She has no cervical adenopathy.  Neurological: She is alert and oriented to person, place, and time. Gait normal.  Skin: Skin is warm and dry. No rash noted. She is not diaphoretic.  Psychiatric: Memory, affect and judgment normal.    Lab Results   Component Value Date   TSH 1.08 03/16/2012   Lab Results  Component Value Date   WBC 5.2 03/16/2012   HGB 12.0 03/16/2012   HCT 36.3 03/16/2012   MCV 91.1 03/16/2012   PLT 293.0 03/16/2012   Lab Results  Component Value Date   CREATININE 0.6 03/16/2012   BUN 11 03/16/2012   NA 140 03/16/2012  K 4.3 03/16/2012   CL 105 03/16/2012   CO2 27 03/16/2012   Lab Results  Component Value Date   ALT 12 03/16/2012   AST 12 03/16/2012   ALKPHOS 40 03/16/2012   BILITOT 0.4 03/16/2012   Lab Results  Component Value Date   CHOL 174 03/16/2012   Lab Results  Component Value Date   HDL 50.40 03/16/2012   Lab Results  Component Value Date   LDLCALC 106* 03/16/2012   Lab Results  Component Value Date   TRIG 90.0 03/16/2012   Lab Results  Component Value Date   CHOLHDL 3 03/16/2012     Assessment & Plan  Diabetes mellitus type 2, uncontrolled Doing much better her hgba1c has dropped from 14.7 to 7.9. Has occasional (once a week sugar) over 200 in evening but has seen 82 in evening as well. Morning numbers are 90 to 120. She takes Lantus 12 units daily most days but if her pm sugar has been above 150 she will increase to 14 units just for that day. She is encouraged to continue with her dietary changes and increase exercise no other changes to meds at this time, she will call if numbers worsen  Hyperlipidemia Good improvement with dietary and exercise changes. Has started MegaRed krill oil continue the same, avoid trans fats and increase exercise, add a vitamin B complex. Recheck in 3 months  Anxiety and depression Declines daily meds but is struggling with increased stress with sick family members etc. She has used Alprazolam intermittently in past with good results, is given an rx to use sparingly and she is asked to return if symptoms worsen and she needs a daily medication  Insomnia Discussed sleep hygiene and may use Alprazolam prn  GERD (gastroesophageal reflux disease) Improved with  dietary changes

## 2012-04-13 ENCOUNTER — Telehealth: Payer: Self-pay

## 2012-04-13 NOTE — Telephone Encounter (Signed)
Patient called asking for a Lantus pen sample. I informed pt we have 1 box and she can come by to pick it up. Pt will come today between 1 and 1:30.

## 2012-06-16 ENCOUNTER — Other Ambulatory Visit (INDEPENDENT_AMBULATORY_CARE_PROVIDER_SITE_OTHER): Payer: BC Managed Care – PPO

## 2012-06-16 DIAGNOSIS — E785 Hyperlipidemia, unspecified: Secondary | ICD-10-CM

## 2012-06-16 DIAGNOSIS — E049 Nontoxic goiter, unspecified: Secondary | ICD-10-CM

## 2012-06-16 LAB — HEMOGLOBIN A1C: Hgb A1c MFr Bld: 7.5 % — ABNORMAL HIGH (ref 4.6–6.5)

## 2012-06-16 LAB — CBC
MCHC: 32.9 g/dL (ref 30.0–36.0)
RDW: 14.5 % (ref 11.5–14.6)
WBC: 5.6 10*3/uL (ref 4.5–10.5)

## 2012-06-17 LAB — RENAL FUNCTION PANEL
Albumin: 3.6 g/dL (ref 3.5–5.2)
BUN: 10 mg/dL (ref 6–23)
CO2: 28 mEq/L (ref 19–32)
Calcium: 9.1 mg/dL (ref 8.4–10.5)
Chloride: 106 mEq/L (ref 96–112)
Phosphorus: 3.4 mg/dL (ref 2.3–4.6)
Potassium: 4.6 mEq/L (ref 3.5–5.1)

## 2012-06-17 LAB — HEPATIC FUNCTION PANEL
Alkaline Phosphatase: 39 U/L (ref 39–117)
Bilirubin, Direct: 0 mg/dL (ref 0.0–0.3)
Total Bilirubin: 0.5 mg/dL (ref 0.3–1.2)
Total Protein: 6.7 g/dL (ref 6.0–8.3)

## 2012-06-17 LAB — LIPID PANEL: VLDL: 20.4 mg/dL (ref 0.0–40.0)

## 2012-06-23 ENCOUNTER — Telehealth: Payer: Self-pay

## 2012-06-23 ENCOUNTER — Encounter: Payer: Self-pay | Admitting: Family Medicine

## 2012-06-23 ENCOUNTER — Ambulatory Visit (INDEPENDENT_AMBULATORY_CARE_PROVIDER_SITE_OTHER): Payer: BC Managed Care – PPO | Admitting: Family Medicine

## 2012-06-23 VITALS — BP 111/75 | HR 91 | Temp 98.8°F | Ht 63.0 in | Wt 133.8 lb

## 2012-06-23 DIAGNOSIS — L578 Other skin changes due to chronic exposure to nonionizing radiation: Secondary | ICD-10-CM

## 2012-06-23 DIAGNOSIS — E785 Hyperlipidemia, unspecified: Secondary | ICD-10-CM

## 2012-06-23 DIAGNOSIS — G43909 Migraine, unspecified, not intractable, without status migrainosus: Secondary | ICD-10-CM

## 2012-06-23 DIAGNOSIS — E119 Type 2 diabetes mellitus without complications: Secondary | ICD-10-CM

## 2012-06-23 DIAGNOSIS — F341 Dysthymic disorder: Secondary | ICD-10-CM

## 2012-06-23 DIAGNOSIS — Z23 Encounter for immunization: Secondary | ICD-10-CM

## 2012-06-23 DIAGNOSIS — L989 Disorder of the skin and subcutaneous tissue, unspecified: Secondary | ICD-10-CM

## 2012-06-23 DIAGNOSIS — E1165 Type 2 diabetes mellitus with hyperglycemia: Secondary | ICD-10-CM

## 2012-06-23 DIAGNOSIS — F419 Anxiety disorder, unspecified: Secondary | ICD-10-CM

## 2012-06-23 HISTORY — DX: Migraine, unspecified, not intractable, without status migrainosus: G43.909

## 2012-06-23 HISTORY — DX: Other skin changes due to chronic exposure to nonionizing radiation: L57.8

## 2012-06-23 MED ORDER — METFORMIN HCL 1000 MG PO TABS: 1000.0000 mg | ORAL_TABLET | Freq: Two times a day (BID) | ORAL | Status: DC

## 2012-06-23 MED ORDER — INSULIN GLARGINE 100 UNIT/ML ~~LOC~~ SOLN: 12.0000 [IU] | Freq: Every day | SUBCUTANEOUS | Status: DC

## 2012-06-23 MED ORDER — SIMVASTATIN 10 MG PO TABS: 10.0000 mg | ORAL_TABLET | Freq: Every day | ORAL | Status: DC

## 2012-06-23 MED ORDER — COENZYME Q10 30 MG PO CAPS: 30.0000 mg | ORAL_CAPSULE | Freq: Two times a day (BID) | ORAL | Status: AC

## 2012-06-23 MED ORDER — RIZATRIPTAN BENZOATE 10 MG PO TABS: 10.0000 mg | ORAL_TABLET | ORAL | Status: AC | PRN

## 2012-06-23 NOTE — Telephone Encounter (Signed)
Left a detailed message on patients cell phone

## 2012-06-23 NOTE — Assessment & Plan Note (Signed)
Has a cholesterol deposit near left eye and a dark spot under right eye both appear benign but with sun damage history will refer for further evaluation

## 2012-06-23 NOTE — Progress Notes (Signed)
Patient ID: Robin Barry, female   DOB: 01-Nov-1965, 46 y.o.   MRN: [id number] Lundynn Cohoon [id number] 03-16-1966 06/23/2012      Progress Note-Follow Up  Subjective  Chief Complaint  Chief Complaint  Patient presents with  . Follow-up    3 month    HPI  Patient is a 46 year old Caucasian female in today for followup on her diabetes and multiple medical problems. Overall she says she's been feeling relatively well although she noticed being under a great deal of stress. She recently had her 10 year old dog put to sleep. That her daughter may be tearing a child with birth defects and they have to wait for another week to be worked . She is having increased headaches with some episodes of numbness on the left side of her face result. Advil does help but it. He did not attend when she was under a great deal of stress she had a week long headache with neurologic symptoms and presented to the emergency room. Was diagnosed with neurologic migraine given Tessalon Perles which did break the headache. She is also complaining of 2 new lesions on her face about 2 months where. One is light-colored and under her left eye one is dark colored and under her right eye. They do not itch have not changed since she noticed them. Her sugars are generally well-controlled and her lowest morning #[id number] her highest 135. Most numbers are between 101 35. After eating she generally 150-200 while on vacation she did have a number of 313. Since being home she stays below 200. She uses Lantus 12 units in the morning most days if her sugars ever above 150 in the morning she will take 14 units but that is rare. She denies polyuria or polydipsia. She's not had any recent illness. No fevers or chills. No chest pain, palpitations, shortness of breath, GI or GU complaints noted  Past Medical History  Diagnosis Date  . Chicken pox as a child  . Measles as a child  . Allergic rhinitis     ragweed  . GERD (gastroesophageal  reflux disease)   . Hypoglycemia 2006  . Anxiety   . Depression   . H/O otitis media     childhood  . Anxiety and depression   . Hemorrhoids 12/26/2011  . Enlarged thyroid 12/26/2011  . Preventative health care 12/26/2011  . Psoriasis 12/26/2011  . Diabetes mellitus type 2, uncontrolled   . Hyperlipidemia 03/23/2012  . Insomnia 03/23/2012  . Sun-damaged skin 06/23/2012    Past Surgical History  Procedure Date  . Tubal ligation 1987    Family History  Problem Relation Age of Onset  . COPD Mother     smokes  . Leukemia Mother     chronic lymphatic  . Depression Mother   . Cancer Mother     skin, CLL  . Hypertension Father   . Cancer Father     prostate  . Diabetes Brother 20    Type 1  . Kidney disease Brother     dialysis  . Other Son     brain hemorrhage  . Cancer Maternal Grandmother     breast  . Alzheimer's disease Maternal Grandmother   . Cancer Maternal Grandfather     melanoma  . Cancer Paternal Grandmother     breast  . Emphysema Paternal Grandmother   . Alcohol abuse Paternal Grandfather   . Cancer Paternal Grandfather     prostate  . Other Maternal Aunt  thalessemia  . Other Maternal Aunt     thalesemia   . Cancer Maternal Aunt     lung  . Other Maternal Aunt     thalesemmia    History   Social History  . Marital Status: Married    Spouse Name: N/A    Number of Children: N/A  . Years of Education: N/A   Occupational History  . Not on file.   Social History Main Topics  . Smoking status: Former Smoker -- 0.5 packs/day for 20 years    Types: Cigarettes    Quit date: 01/24/2010  . Smokeless tobacco: Never Used  . Alcohol Use: Yes     occasionally a little bit of wine  . Drug Use: No  . Sexually Active: Yes -- Female partner(s)   Other Topics Concern  . Not on file   Social History Narrative  . No narrative on file    Current Outpatient Prescriptions on File Prior to Visit  Medication Sig Dispense Refill  . cetirizine (ZYRTEC) 10  MG tablet Take 10 mg by mouth 2 (two) times daily.      Marland Kitchen glucose blood test strip 1 each by Other route 4 (four) times daily.  300 each  2  . Insulin Pen Needle (BD PEN NEEDLE NANO U/F) 32G X 4 MM MISC As directed  100 each  2  . Lancets (FREESTYLE) lancets 1 each by Other route 4 (four) times daily.  300 each  2  . lisinopril (PRINIVIL,ZESTRIL) 5 MG tablet Take 1 tablet (5 mg total) by mouth daily.  90 tablet  3  . NON FORMULARY Take 1 each by mouth daily. MEGA RED Multivitamin.      Marland Kitchen omeprazole (PRILOSEC) 10 MG capsule Take 10 mg by mouth daily.      . Probiotic Product (PROBIOTIC PEARLS ADVANTAGE) CAPS Take 1 capsule by mouth daily.      . simvastatin (ZOCOR) 10 MG tablet Take 1 tablet (10 mg total) by mouth at bedtime.  30 tablet  5    Allergies  Allergen Reactions  . Bactrim     rash  . Cephalexin     rash  . Codeine     hallucinations  . Terramycin     Review of Systems  Review of Systems  Constitutional: Negative for fever and malaise/fatigue.  HENT: Negative for congestion.   Eyes: Negative for discharge.  Respiratory: Negative for shortness of breath.   Cardiovascular: Negative for chest pain, palpitations and leg swelling.  Gastrointestinal: Negative for nausea, abdominal pain and diarrhea.  Genitourinary: Negative for dysuria.  Musculoskeletal: Negative for falls.  Skin: Negative for rash.  Neurological: Negative for loss of consciousness and headaches.  Endo/Heme/Allergies: Negative for polydipsia.  Psychiatric/Behavioral: Negative for depression and suicidal ideas. The patient is nervous/anxious and has insomnia.     Objective  BP 111/75  Pulse 91  Temp 98.8 F (37.1 C) (Temporal)  Ht 5\' 3"  (1.6 m)  Wt 133 lb 12.8 oz (60.691 kg)  BMI 23.70 kg/m2  SpO2 100%  LMP 06/10/2012  Physical Exam  Physical Exam  Constitutional: She is oriented to person, place, and time and well-developed, well-nourished, and in no distress. No distress.  HENT:  Head:  Normocephalic and atraumatic.  Eyes: Conjunctivae normal are normal.  Neck: Neck supple. No thyromegaly present.  Cardiovascular: Normal rate, regular rhythm and normal heart sounds.   No murmur heard. Pulmonary/Chest: Effort normal and breath sounds normal. She has no wheezes.  Abdominal:  She exhibits no distension and no mass.  Musculoskeletal: She exhibits no edema.  Lymphadenopathy:    She has no cervical adenopathy.  Neurological: She is alert and oriented to person, place, and time.  Skin: Skin is warm and dry. No rash noted. She is not diaphoretic.       Small yellow deposit under left eye and flat brown spot under right eye  Psychiatric: Memory, affect and judgment normal.    Lab Results  Component Value Date   TSH 1.55 06/16/2012   Lab Results  Component Value Date   WBC 5.6 06/16/2012   HGB 12.3 06/16/2012   HCT 37.3 06/16/2012   MCV 90.7 06/16/2012   PLT 308.0 06/16/2012   Lab Results  Component Value Date   CREATININE 0.8 06/16/2012   BUN 10 06/16/2012   NA 140 06/16/2012   K 4.6 06/16/2012   CL 106 06/16/2012   CO2 28 06/16/2012   Lab Results  Component Value Date   ALT 13 06/16/2012   AST 16 06/16/2012   ALKPHOS 39 06/16/2012   BILITOT 0.5 06/16/2012   Lab Results  Component Value Date   CHOL 212* 06/16/2012   Lab Results  Component Value Date   HDL 46.50 06/16/2012   Lab Results  Component Value Date   LDLCALC 106* 03/16/2012   Lab Results  Component Value Date   TRIG 102.0 06/16/2012   Lab Results  Component Value Date   CHOLHDL 5 06/16/2012     Assessment & Plan  Diabetes mellitus type 2, uncontrolled Improving numbers but hgba1c still 7.5, will increase Metformin 1000 mg po bid and continue Lantus at 12 to 14 as directed. Takes 12 if her sugar is below 150 and 14 if it is above, rarely needs the 14. Encouraged minimize simple carbs and increase exercise. Referred to Opthamology for eye exam  Hyperlipidemia Numbers have trended  up. Will start Simvastatin. 10 mg qhs and Co enzyme Q 10, avoid trans fats  Anxiety and depression Patient under stress due to having to put her dog to sleep and daughter is getting genetic testing next week to rule out possibility of current pregnancy being down's or some other genetic disorder. She is instructed to use her alprazolam as needed the next week and then decrease her use again when able  Sun-damaged skin Has a cholesterol deposit near left eye and a dark spot under right eye both appear benign but with sun damage history will refer for further evaluation  Migraine Continue Advil 400 mg prn, if migrainous symptoms return has had good results with Maxalt in past. Will allow a refill to use as needed

## 2012-06-23 NOTE — Assessment & Plan Note (Signed)
Numbers have trended up. Will start Simvastatin. 10 mg qhs and Co enzyme Q 10, avoid trans fats

## 2012-06-23 NOTE — Assessment & Plan Note (Addendum)
Improving numbers but hgba1c still 7.5, will increase Metformin 1000 mg po bid and continue Lantus at 12 to 14 as directed. Takes 12 if her sugar is below 150 and 14 if it is above, rarely needs the 14. Encouraged minimize simple carbs and increase exercise. Referred to Opthamology for eye exam

## 2012-06-23 NOTE — Assessment & Plan Note (Signed)
Patient under stress due to having to put her dog to sleep and daughter is getting genetic testing next week to rule out possibility of current pregnancy being down's or some other genetic disorder. She is instructed to use her alprazolam as needed the next week and then decrease her use again when able

## 2012-06-23 NOTE — Assessment & Plan Note (Signed)
Continue Advil 400 mg prn, if migrainous symptoms return has had good results with Maxalt in past. Will allow a refill to use as needed

## 2012-06-23 NOTE — Telephone Encounter (Signed)
Message copied by Court Joy on Tue Jun 23, 2012 10:34 AM ------      Message from: Danise Edge A      Created: Tue Jun 23, 2012 10:19 AM       She and I discussed the fact that she has used maxalt in past but I did not give her an rx. Please let her know I have sent in an rx to her pharmacy with a note to not fill unless she requests it

## 2012-06-23 NOTE — Patient Instructions (Addendum)
Diabetes and Exercise  Regular exercise is important and can help:   · Control blood glucose (sugar).  · Decrease blood pressure.  ·   · Control blood lipids (cholesterol, triglycerides).  · Improve overall health.  BENEFITS FROM EXERCISE  · Improved fitness.  · Improved flexibility.  · Improved endurance.  · Increased bone density.  · Weight control.  · Increased muscle strength.  · Decreased body fat.  · Improvement of the body's use of insulin, a hormone.  · Increased insulin sensitivity.  · Reduction of insulin needs.  · Reduced stress and tension.  · Helps you feel better.  People with diabetes who add exercise to their lifestyle gain additional benefits, including:  · Weight loss.  · Reduced appetite.  · Improvement of the body's use of blood glucose.  · Decreased risk factors for heart disease:  · Lowering of cholesterol and triglycerides.  · Raising the level of good cholesterol (high-density lipoproteins, HDL).  · Lowering blood sugar.  · Decreased blood pressure.  TYPE 1 DIABETES AND EXERCISE  · Exercise will usually lower your blood glucose.  · If blood glucose is greater than 240 mg/dl, check urine ketones. If ketones are present, do not exercise.  · Location of the insulin injection sites may need to be adjusted with exercise. Avoid injecting insulin into areas of the body that will be exercised. For example, avoid injecting insulin into:  · The arms when playing tennis.  · The legs when jogging. For more information, discuss this with your caregiver.  · Keep a record of:  · Food intake.  · Type and amount of exercise.  · Expected peak times of insulin action.  · Blood glucose levels.  Do this before, during, and after exercise. Review your records with your caregiver. This will help you to develop guidelines for adjusting food intake and insulin amounts.   TYPE 2 DIABETES AND EXERCISE  · Regular physical activity can help control blood glucose.  · Exercise is important because it may:  · Increase the  body's sensitivity to insulin.  · Improve blood glucose control.  · Exercise reduces the risk of heart disease. It decreases serum cholesterol and triglycerides. It also lowers blood pressure.  · Those who take insulin or oral hypoglycemic agents should watch for signs of hypoglycemia. These signs include dizziness, shaking, sweating, chills, and confusion.  · Body water is lost during exercise. It must be replaced. This will help to avoid loss of body fluids (dehydration) or heat stroke.  Be sure to talk to your caregiver before starting an exercise program to make sure it is safe for you. Remember, any activity is better than none.   Document Released: 11/02/2003 Document Revised: 11/04/2011 Document Reviewed: 02/16/2009  ExitCare® Patient Information ©2013 [employer].

## 2012-09-16 ENCOUNTER — Other Ambulatory Visit: Payer: BC Managed Care – PPO

## 2012-09-23 ENCOUNTER — Ambulatory Visit: Payer: BC Managed Care – PPO | Admitting: Family Medicine

## 2012-10-05 ENCOUNTER — Ambulatory Visit: Payer: BC Managed Care – PPO | Admitting: Family Medicine

## 2012-10-27 ENCOUNTER — Ambulatory Visit: Payer: BC Managed Care – PPO | Admitting: Family Medicine

## 2012-10-27 ENCOUNTER — Other Ambulatory Visit: Payer: Self-pay | Admitting: Family Medicine

## 2012-10-27 ENCOUNTER — Encounter: Payer: Self-pay | Admitting: Family Medicine

## 2012-10-27 ENCOUNTER — Ambulatory Visit (INDEPENDENT_AMBULATORY_CARE_PROVIDER_SITE_OTHER): Payer: BC Managed Care – PPO | Admitting: Family Medicine

## 2012-10-27 VITALS — BP 132/82 | HR 85 | Temp 97.5°F | Ht 60.25 in | Wt 135.1 lb

## 2012-10-27 DIAGNOSIS — E785 Hyperlipidemia, unspecified: Secondary | ICD-10-CM

## 2012-10-27 DIAGNOSIS — K219 Gastro-esophageal reflux disease without esophagitis: Secondary | ICD-10-CM

## 2012-10-27 DIAGNOSIS — B001 Herpesviral vesicular dermatitis: Secondary | ICD-10-CM

## 2012-10-27 LAB — HEPATIC FUNCTION PANEL
Albumin: 4.7 g/dL (ref 3.5–5.2)
Alkaline Phosphatase: 54 U/L (ref 39–117)
Total Bilirubin: 0.5 mg/dL (ref 0.3–1.2)

## 2012-10-27 LAB — BASIC METABOLIC PANEL
CO2: 27 mEq/L (ref 19–32)
Chloride: 103 mEq/L (ref 96–112)
Glucose, Bld: 139 mg/dL — ABNORMAL HIGH (ref 70–99)
Potassium: 4 mEq/L (ref 3.5–5.3)
Sodium: 140 mEq/L (ref 135–145)

## 2012-10-27 LAB — HEMOGLOBIN A1C
Hgb A1c MFr Bld: 8.3 % — ABNORMAL HIGH (ref <5.7)
Mean Plasma Glucose: 192 mg/dL — ABNORMAL HIGH (ref <117)

## 2012-10-27 LAB — CBC
HCT: 40.1 % (ref 36.0–46.0)
Hemoglobin: 13.6 g/dL (ref 12.0–15.0)
MCHC: 33.9 g/dL (ref 30.0–36.0)
RBC: 4.59 MIL/uL (ref 3.87–5.11)
WBC: 6.3 10*3/uL (ref 4.0–10.5)

## 2012-10-27 LAB — LIPID PANEL
HDL: 55 mg/dL (ref >39)
LDL Cholesterol: 156 mg/dL — ABNORMAL HIGH (ref 0–99)
VLDL: 26 mg/dL (ref 0–40)

## 2012-10-27 MED ORDER — GLIMEPIRIDE 2 MG PO TABS: 2.0000 mg | ORAL_TABLET | Freq: Every day | ORAL | Status: DC

## 2012-10-27 MED ORDER — ACYCLOVIR 400 MG PO TABS: 400.0000 mg | ORAL_TABLET | Freq: Every day | ORAL | Status: DC

## 2012-10-27 MED ORDER — METFORMIN HCL 500 MG PO TABS: 500.0000 mg | ORAL_TABLET | Freq: Two times a day (BID) | ORAL | Status: DC

## 2012-10-27 NOTE — Patient Instructions (Addendum)
Herpes Labialis You have a fever blister or cold sore (herpes labialis). These painful, grouped sores are caused by one of the herpes viruses (HSV1 most commonly). They are usually found around the lips and mouth, but the same infection can also affect other areas on the face such as the nose and eyes. Herpes infections take about 10 days to heal. They often occur again and again in the same spot. Other symptoms may include numbness and tingling in the involved skin, achiness, fever, and swollen glands in the neck. Colds, emotional stress, injuries, or excess sunlight exposure all seem to make herpes reappear. Herpes lip infections are contagious. Direct contact with these sores can spread the infection. It can also be spread to other parts of your own body. TREATMENT  Herpes labialis is usually self-limited and resolves within 1 week. To reduce pain and swelling, apply ice packs frequently to the sores or suck on popsicles or frozen juice bars. Antiviral medicine may be used by mouth to shorten the duration of the breakout. Avoid spreading the infection by washing your hands often. Be careful not to touch your eyes or genital areas after handling the infected blisters. Do not kiss or have other intimate contact with others. After the blisters are completely healed you may resume contact. Use sunscreen to lessen recurrences.  If this is your first infection with herpes, or if you have a severe or repeated infections, your caregiver may prescribe one of the anti-viral drugs to speed up the healing. If you have sun-related flare-ups despite the use of sunscreen, starting oral anti-viral medicine before a prolonged exposure (going skiing or to the beach) can prevent most episodes.  SEEK IMMEDIATE MEDICAL CARE IF:  You develop a headache, sleepiness, high fever, vomiting, or severe weakness.  You have eye irritation, pain, blurred vision or redness.  You develop a prolonged infection not getting better in 10  days. Document Released: 08/12/2005 Document Revised: 11/04/2011 Document Reviewed: 06/16/2009 Exit[employer] Patient Information 2013 [employer].  

## 2012-10-28 NOTE — Progress Notes (Signed)
Quick Note:  Patient Informed and voiced understanding. Pt states she has plenty of 10mg  now to take 2 and will contact us when she needs a refill. I will update MAR ______

## 2012-11-03 NOTE — Assessment & Plan Note (Signed)
Generally well controlled, did have some episodes of nausea and motion sickness over the holidays but that has now resolved. Avoid offending foods

## 2012-11-03 NOTE — Assessment & Plan Note (Signed)
Poorly controlled, start Metformin 500 mg po bid and minimize simple carbs.

## 2012-11-03 NOTE — Progress Notes (Signed)
Patient ID: Robin Barry, female   DOB: [birth year], 47 y.o.   MRN: [id number] Robin Barry [id number] 10-16-1965 11/03/2012      Progress Note-Follow Up  Subjective  Chief Complaint  Chief Complaint  Patient presents with  . Follow-up    w/ labs    HPI  47 year old female who is in today for followup. Generally doing well. No recent illness, fevers, headache, chest pain, palpitations, shortness or breath GI or GU complaints. Over the holiday she did notice some ocean sickness and nausea while a [place] in [place] but otherwise says she's felt well. She notes she has not had a menstrual cycle this month. No abdominal pain or back pain.  Past Medical History  Diagnosis Date  . Chicken pox as a child  . Measles as a child  . Allergic rhinitis     ragweed  . GERD (gastroesophageal reflux disease)   . Hypoglycemia 2006  . Anxiety   . Depression   . H/O otitis media     childhood  . Anxiety and depression   . Hemorrhoids 12/26/2011  . Enlarged thyroid 12/26/2011  . Preventative health care 12/26/2011  . Psoriasis 12/26/2011  . Diabetes mellitus type 2, uncontrolled   . Hyperlipidemia 03/23/2012  . Insomnia 03/23/2012  . Sun-damaged skin 06/23/2012  . Migraine 06/23/2012    Past Surgical History  Procedure Laterality Date  . Tubal ligation  1987    Family History  Problem Relation Age of Onset  . COPD Mother     smokes  . Leukemia Mother     chronic lymphatic  . Depression Mother   . Cancer Mother     skin, CLL  . Hypertension Father   . Cancer Father     prostate  . Diabetes Brother 20    Type 1  . Kidney disease Brother     dialysis  . Other Son     brain hemorrhage  . Cancer Maternal Grandmother     breast  . Alzheimer's disease Maternal Grandmother   . Cancer Maternal Grandfather     melanoma  . Cancer Paternal Grandmother     breast  . Emphysema Paternal Grandmother   . Alcohol abuse Paternal Grandfather   . Cancer Paternal Grandfather     prostate  .  Other Maternal Aunt     thalessemia  . Other Maternal Aunt     thalesemia   . Cancer Maternal Aunt     lung  . Other Maternal Aunt     thalesemmia    History   Social History  . Marital Status: Married    Spouse Name: N/A    Number of Children: N/A  . Years of Education: N/A   Occupational History  . Not on file.   Social History Main Topics  . Smoking status: Former Smoker -- 0.50 packs/day for 20 years    Types: Cigarettes    Quit date: 01/24/2010  . Smokeless tobacco: Never Used  . Alcohol Use: Yes     Comment: occasionally a little bit of wine  . Drug Use: No  . Sexually Active: Yes -- Female partner(s)   Other Topics Concern  . Not on file   Social History Narrative  . No narrative on file    Current Outpatient Prescriptions on File Prior to Visit  Medication Sig Dispense Refill  . cetirizine (ZYRTEC) 10 MG tablet Take 10 mg by mouth 2 (two) times daily.      Marland Kitchen glucose blood test  strip 1 each by Other route 4 (four) times daily.  300 each  2  . insulin glargine (LANTUS SOLOSTAR) 100 UNIT/ML injection Inject 12 Units into the skin daily.  5 pen  PRN  . Insulin Pen Needle (BD PEN NEEDLE NANO U/F) 32G X 4 MM MISC As directed  100 each  2  . Lancets (FREESTYLE) lancets 1 each by Other route 4 (four) times daily.  300 each  2  . lisinopril (PRINIVIL,ZESTRIL) 5 MG tablet Take 1 tablet (5 mg total) by mouth daily.  90 tablet  3  . NON FORMULARY Take 1 each by mouth daily. MEGA RED Multivitamin.      Marland Kitchen omeprazole (PRILOSEC) 10 MG capsule Take 10 mg by mouth daily.      . Probiotic Product (PROBIOTIC PEARLS ADVANTAGE) CAPS Take 1 capsule by mouth daily.      . rizatriptan (MAXALT) 10 MG tablet Take 1 tablet (10 mg total) by mouth as needed for migraine. Only fill if patient requests.  10 tablet  0  . co-enzyme Q-10 30 MG capsule Take 1 capsule (30 mg total) by mouth 2 (two) times daily.       No current facility-administered medications on file prior to visit.     Allergies  Allergen Reactions  . Bactrim     rash  . Cephalexin     rash  . Codeine     hallucinations  . Terramycin     Review of Systems  Review of Systems  Constitutional: Negative for fever and malaise/fatigue.  HENT: Negative for congestion.   Eyes: Negative for discharge.  Respiratory: Negative for shortness of breath.   Cardiovascular: Negative for chest pain, palpitations and leg swelling.  Gastrointestinal: Negative for nausea, abdominal pain and diarrhea.  Genitourinary: Negative for dysuria.  Musculoskeletal: Negative for falls.  Skin: Negative for rash.  Neurological: Negative for loss of consciousness and headaches.  Endo/Heme/Allergies: Negative for polydipsia.  Psychiatric/Behavioral: Negative for depression and suicidal ideas. The patient is not nervous/anxious and does not have insomnia.     Objective  BP 132/82  Pulse 85  Temp(Src) 97.5 F (36.4 C) (Oral)  Ht 5' 0.25" (1.53 m)  Wt 135 lb 1.3 oz (61.272 kg)  BMI 26.17 kg/m2  SpO2 96%  LMP 10/17/2012  Physical Exam  Physical Exam  Constitutional: She is oriented to person, place, and time and well-developed, well-nourished, and in no distress. No distress.  HENT:  Head: Normocephalic and atraumatic.  Eyes: Conjunctivae are normal.  Neck: Neck supple. No thyromegaly present.  Cardiovascular: Normal rate, regular rhythm and normal heart sounds.   No murmur heard. Pulmonary/Chest: Effort normal and breath sounds normal. She has no wheezes.  Abdominal: She exhibits no distension and no mass.  Musculoskeletal: She exhibits no edema.  Lymphadenopathy:    She has no cervical adenopathy.  Neurological: She is alert and oriented to person, place, and time.  Skin: Skin is warm and dry. No rash noted. She is not diaphoretic.  Psychiatric: Memory, affect and judgment normal.    Lab Results  Component Value Date   TSH 1.267 10/27/2012   Lab Results  Component Value Date   WBC 6.3 10/27/2012    HGB 13.6 10/27/2012   HCT 40.1 10/27/2012   MCV 87.4 10/27/2012   PLT 315 10/27/2012   Lab Results  Component Value Date   CREATININE 0.72 10/27/2012   BUN 10 10/27/2012   NA 140 10/27/2012   K 4.0 10/27/2012   CL  103 10/27/2012   CO2 27 10/27/2012   Lab Results  Component Value Date   ALT 11 10/27/2012   AST 13 10/27/2012   ALKPHOS 54 10/27/2012   BILITOT 0.5 10/27/2012   Lab Results  Component Value Date   CHOL 237* 10/27/2012   Lab Results  Component Value Date   HDL 55 10/27/2012   Lab Results  Component Value Date   LDLCALC 156* 10/27/2012   Lab Results  Component Value Date   TRIG 131 10/27/2012   Lab Results  Component Value Date   CHOLHDL 4.3 10/27/2012     Assessment & Plan  Diabetes mellitus type 2, uncontrolled Poorly controlled, start Metformin 500 mg po bid and minimize simple carbs.  Hyperlipidemia Tolerating Simvastatin, avoid trans fats, increase exercise  GERD (gastroesophageal reflux disease) Generally well controlled, did have some episodes of nausea and motion sickness over the holidays but that has now resolved. Avoid offending foods

## 2012-11-03 NOTE — Assessment & Plan Note (Signed)
Tolerating Simvastatin, avoid trans fats, increase exercise

## 2012-11-10 ENCOUNTER — Other Ambulatory Visit: Payer: Self-pay

## 2012-11-10 MED ORDER — SIMVASTATIN 20 MG PO TABS: 20.0000 mg | ORAL_TABLET | Freq: Every evening | ORAL | Status: DC

## 2012-11-10 MED ORDER — GLUCOSE BLOOD VI STRP: 1.0000 | ORAL_STRIP | Freq: Four times a day (QID) | Status: DC

## 2012-11-10 MED ORDER — INSULIN PEN NEEDLE 32G X 4 MM MISC: Status: DC

## 2012-11-10 NOTE — Telephone Encounter (Signed)
Per pt call in RX's to [phone]

## 2012-11-12 ENCOUNTER — Telehealth: Payer: Self-pay

## 2012-11-12 NOTE — Telephone Encounter (Signed)
PA sent to [facility] for test strips

## 2012-11-16 ENCOUNTER — Telehealth: Payer: Self-pay

## 2012-11-16 NOTE — Telephone Encounter (Signed)
Patient left a message stating she contacted her insurance and they are mailing her some information. Pt stated she has enough test strips to last her until she gets back to . Pt will then contact us to let us know which meter and test strip she would like to use.  Closing note until then

## 2012-11-16 NOTE — Telephone Encounter (Signed)
Opened in error

## 2012-11-16 NOTE — Telephone Encounter (Signed)
We received paperwork from [facility] stating they will not cover Freestyle lite Test strips. Per MD have pt call [employer] and see what meter and strips they will cover.   Left a detailed message for patient to return my call

## 2012-12-10 ENCOUNTER — Encounter: Payer: Self-pay | Admitting: Family Medicine

## 2012-12-10 ENCOUNTER — Ambulatory Visit (INDEPENDENT_AMBULATORY_CARE_PROVIDER_SITE_OTHER): Payer: BC Managed Care – PPO | Admitting: Family Medicine

## 2012-12-10 VITALS — BP 110/70 | HR 88 | Temp 98.0°F | Resp 16 | Wt 137.0 lb

## 2012-12-10 DIAGNOSIS — E119 Type 2 diabetes mellitus without complications: Secondary | ICD-10-CM

## 2012-12-10 DIAGNOSIS — Z5189 Encounter for other specified aftercare: Secondary | ICD-10-CM

## 2012-12-10 DIAGNOSIS — J209 Acute bronchitis, unspecified: Secondary | ICD-10-CM

## 2012-12-10 DIAGNOSIS — E1165 Type 2 diabetes mellitus with hyperglycemia: Secondary | ICD-10-CM

## 2012-12-10 DIAGNOSIS — T7840XD Allergy, unspecified, subsequent encounter: Secondary | ICD-10-CM

## 2012-12-10 DIAGNOSIS — J019 Acute sinusitis, unspecified: Secondary | ICD-10-CM

## 2012-12-10 DIAGNOSIS — J309 Allergic rhinitis, unspecified: Secondary | ICD-10-CM

## 2012-12-10 DIAGNOSIS — IMO0001 Reserved for inherently not codable concepts without codable children: Secondary | ICD-10-CM

## 2012-12-10 MED ORDER — GLUCOSE BLOOD VI STRP: 1.0000 | ORAL_STRIP | Freq: Four times a day (QID) | Status: DC | PRN

## 2012-12-10 MED ORDER — ACCU-CHEK AVIVA PLUS W/DEVICE KIT: 1.0000 | PACK | Freq: Four times a day (QID) | Status: AC | PRN

## 2012-12-10 MED ORDER — ACCU-CHEK AVIVA VI SOLN: 1.0000 [drp] | Freq: Four times a day (QID) | Status: AC | PRN

## 2012-12-10 MED ORDER — AZITHROMYCIN 250 MG PO TABS: ORAL_TABLET | ORAL | Status: DC

## 2012-12-10 MED ORDER — ACCU-CHEK SOFT TOUCH LANCETS MISC: Status: DC

## 2012-12-10 MED ORDER — MONTELUKAST SODIUM 10 MG PO TABS: 10.0000 mg | ORAL_TABLET | Freq: Every day | ORAL | Status: DC

## 2012-12-10 MED ORDER — HYDROCOD POLST-CHLORPHEN POLST 10-8 MG/5ML PO LQCR: 5.0000 mL | Freq: Every evening | ORAL | Status: DC | PRN

## 2012-12-13 ENCOUNTER — Encounter: Payer: Self-pay | Admitting: Family Medicine

## 2012-12-13 DIAGNOSIS — J019 Acute sinusitis, unspecified: Secondary | ICD-10-CM | POA: Insufficient documentation

## 2012-12-13 NOTE — Assessment & Plan Note (Signed)
Encouraged probiotics, mucinex, cough suppressants if no improvement with increased rest and hydration given rx for antibiotics to take.

## 2012-12-13 NOTE — Assessment & Plan Note (Signed)
Flared recently encouraged daily antihistamines and nasal steroids, nasal saline prn and report if worsens

## 2012-12-13 NOTE — Progress Notes (Signed)
Patient ID: Robin Barry, female   DOB: Oct 03, 1965, 47 y.o.   MRN: [id number] Junice Fei [id number] 12-Feb-1966 12/13/2012      Progress Note-Follow Up  Subjective  Chief Complaint  Chief Complaint  Patient presents with  . Medication Refill    Pt requests Rx for meter, test strips and lancets for accuchek aviva plus. Pt requests written rx.  . Nasal Congestion    Pt reports nasal congestion and night time cough since Saturday.    HPI  Patient is a 47 year old caucasian female hwo is in today with c/o congestion after traveling back from [place] to meet her new grandchild. Notes HA/congestion/malaise/fevers/chills/irritated throat. No SOB/palp. Has not been checking her blood sugars because her meter is not functioning proberly. Notes some clear rhinorrhea this week without pruritus.   Past Medical History  Diagnosis Date  . Chicken pox as a child  . Measles as a child  . Allergic rhinitis     ragweed  . GERD (gastroesophageal reflux disease)   . Hypoglycemia 2006  . Anxiety   . Depression   . H/O otitis media     childhood  . Anxiety and depression   . Hemorrhoids 12/26/2011  . Enlarged thyroid 12/26/2011  . Preventative health care 12/26/2011  . Psoriasis 12/26/2011  . Diabetes mellitus type 2, uncontrolled   . Hyperlipidemia 03/23/2012  . Insomnia 03/23/2012  . Sun-damaged skin 06/23/2012  . Migraine 06/23/2012    Past Surgical History  Procedure Laterality Date  . Tubal ligation  1987    Family History  Problem Relation Age of Onset  . COPD Mother     smokes  . Leukemia Mother     chronic lymphatic  . Depression Mother   . Cancer Mother     skin, CLL  . Hypertension Father   . Cancer Father     prostate  . Diabetes Brother 20    Type 1  . Kidney disease Brother     dialysis  . Other Son     brain hemorrhage  . Cancer Maternal Grandmother     breast  . Alzheimer's disease Maternal Grandmother   . Cancer Maternal Grandfather     melanoma  . Cancer  Paternal Grandmother     breast  . Emphysema Paternal Grandmother   . Alcohol abuse Paternal Grandfather   . Cancer Paternal Grandfather     prostate  . Other Maternal Aunt     thalessemia  . Other Maternal Aunt     thalesemia   . Cancer Maternal Aunt     lung  . Other Maternal Aunt     thalesemmia    History   Social History  . Marital Status: Married    Spouse Name: N/A    Number of Children: N/A  . Years of Education: N/A   Occupational History  . Not on file.   Social History Main Topics  . Smoking status: Former Smoker -- 0.50 packs/day for 20 years    Types: Cigarettes    Quit date: 01/24/2010  . Smokeless tobacco: Never Used  . Alcohol Use: Yes     Comment: occasionally a little bit of wine  . Drug Use: No  . Sexually Active: Yes -- Female partner(s)   Other Topics Concern  . Not on file   Social History Narrative  . No narrative on file    Current Outpatient Prescriptions on File Prior to Visit  Medication Sig Dispense Refill  . cetirizine (ZYRTEC)  10 MG tablet Take 10 mg by mouth 2 (two) times daily.      Marland Kitchen co-enzyme Q-10 30 MG capsule Take 1 capsule (30 mg total) by mouth 2 (two) times daily.      Marland Kitchen glimepiride (AMARYL) 2 MG tablet Take 1 tablet (2 mg total) by mouth daily before breakfast.  30 tablet  3  . glucose blood test strip 1 each by Other route 4 (four) times daily.  300 each  1  . insulin glargine (LANTUS SOLOSTAR) 100 UNIT/ML injection Inject 12 Units into the skin daily.  5 pen  PRN  . Insulin Pen Needle (BD PEN NEEDLE NANO U/F) 32G X 4 MM MISC As directed  100 each  1  . Lancets (FREESTYLE) lancets 1 each by Other route 4 (four) times daily.  300 each  2  . lisinopril (PRINIVIL,ZESTRIL) 5 MG tablet Take 1 tablet (5 mg total) by mouth daily.  90 tablet  3  . metFORMIN (GLUCOPHAGE) 500 MG tablet Take 1 tablet (500 mg total) by mouth 2 (two) times daily with a meal.  180 tablet  3  . NON FORMULARY Take 1 each by mouth daily. MEGA RED  Multivitamin.      Marland Kitchen omeprazole (PRILOSEC) 10 MG capsule Take 10 mg by mouth daily.      . Probiotic Product (PROBIOTIC PEARLS ADVANTAGE) CAPS Take 1 capsule by mouth daily.      . rizatriptan (MAXALT) 10 MG tablet Take 1 tablet (10 mg total) by mouth as needed for migraine. Only fill if patient requests.  10 tablet  0  . simvastatin (ZOCOR) 20 MG tablet Take 1 tablet (20 mg total) by mouth every evening.  90 tablet  1  . acyclovir (ZOVIRAX) 400 MG tablet Take 1 tablet (400 mg total) by mouth 5 (five) times daily.  35 tablet  0   No current facility-administered medications on file prior to visit.    Allergies  Allergen Reactions  . Bactrim     rash  . Cephalexin     rash  . Codeine     hallucinations  . Terramycin     Review of Systems  Review of Systems  Constitutional: Negative for fever and malaise/fatigue.  HENT: Positive for congestion.   Eyes: Negative for discharge.  Respiratory: Positive for cough and sputum production. Negative for shortness of breath.   Cardiovascular: Negative for chest pain, palpitations and leg swelling.  Gastrointestinal: Negative for nausea, abdominal pain and diarrhea.  Genitourinary: Negative for dysuria.  Musculoskeletal: Negative for falls.  Skin: Negative for rash.  Neurological: Positive for headaches. Negative for loss of consciousness.  Endo/Heme/Allergies: Negative for polydipsia.  Psychiatric/Behavioral: Negative for depression and suicidal ideas. The patient is not nervous/anxious and does not have insomnia.     Objective  BP 110/70  Pulse 88  Temp(Src) 98 F (36.7 C) (Oral)  Resp 16  Wt 137 lb (62.143 kg)  BMI 26.55 kg/m2  SpO2 99%  Physical Exam  Physical Exam  Constitutional: She is oriented to person, place, and time and well-developed, well-nourished, and in no distress. No distress.  HENT:  Head: Normocephalic and atraumatic.  Eyes: Conjunctivae are normal.  Neck: Neck supple. No thyromegaly present.   Cardiovascular: Normal rate, regular rhythm and normal heart sounds.   No murmur heard. Pulmonary/Chest: Effort normal and breath sounds normal. She has no wheezes.  Abdominal: She exhibits no distension and no mass.  Musculoskeletal: She exhibits no edema.  Lymphadenopathy:  She has no cervical adenopathy.  Neurological: She is alert and oriented to person, place, and time.  Skin: Skin is warm and dry. No rash noted. She is not diaphoretic.  Psychiatric: Memory, affect and judgment normal.    Lab Results  Component Value Date   TSH 1.267 10/27/2012   Lab Results  Component Value Date   WBC 6.3 10/27/2012   HGB 13.6 10/27/2012   HCT 40.1 10/27/2012   MCV 87.4 10/27/2012   PLT 315 10/27/2012   Lab Results  Component Value Date   CREATININE 0.72 10/27/2012   BUN 10 10/27/2012   NA 140 10/27/2012   K 4.0 10/27/2012   CL 103 10/27/2012   CO2 27 10/27/2012   Lab Results  Component Value Date   ALT 11 10/27/2012   AST 13 10/27/2012   ALKPHOS 54 10/27/2012   BILITOT 0.5 10/27/2012   Lab Results  Component Value Date   CHOL 237* 10/27/2012   Lab Results  Component Value Date   HDL 55 10/27/2012   Lab Results  Component Value Date   LDLCALC 156* 10/27/2012   Lab Results  Component Value Date   TRIG 131 10/27/2012   Lab Results  Component Value Date   CHOLHDL 4.3 10/27/2012     Assessment & Plan  Allergic rhinitis Flared recently encouraged daily antihistamines and nasal steroids, nasal saline prn and report if worsens  Sinusitis, acute Encouraged probiotics, mucinex, cough suppressants if no improvement with increased rest and hydration given rx for antibiotics to take.  Diabetes mellitus type 2, uncontrolled Given rx for new glucometer and supplies to check sugars tid and prn. Minimize simple carbs and continue current meds

## 2012-12-13 NOTE — Assessment & Plan Note (Signed)
Given rx for new glucometer and supplies to check sugars tid and prn. Minimize simple carbs and continue current meds

## 2013-01-26 ENCOUNTER — Ambulatory Visit (INDEPENDENT_AMBULATORY_CARE_PROVIDER_SITE_OTHER): Payer: BC Managed Care – PPO | Admitting: Family Medicine

## 2013-01-26 ENCOUNTER — Encounter: Payer: Self-pay | Admitting: Family Medicine

## 2013-01-26 VITALS — BP 122/78 | HR 85 | Temp 98.0°F | Ht 60.25 in | Wt 138.1 lb

## 2013-01-26 DIAGNOSIS — T7840XD Allergy, unspecified, subsequent encounter: Secondary | ICD-10-CM

## 2013-01-26 DIAGNOSIS — K219 Gastro-esophageal reflux disease without esophagitis: Secondary | ICD-10-CM

## 2013-01-26 DIAGNOSIS — IMO0002 Reserved for concepts with insufficient information to code with codable children: Secondary | ICD-10-CM

## 2013-01-26 DIAGNOSIS — E785 Hyperlipidemia, unspecified: Secondary | ICD-10-CM

## 2013-01-26 DIAGNOSIS — E1165 Type 2 diabetes mellitus with hyperglycemia: Secondary | ICD-10-CM

## 2013-01-26 DIAGNOSIS — Z5189 Encounter for other specified aftercare: Secondary | ICD-10-CM

## 2013-01-26 DIAGNOSIS — IMO0001 Reserved for inherently not codable concepts without codable children: Secondary | ICD-10-CM

## 2013-01-26 DIAGNOSIS — J019 Acute sinusitis, unspecified: Secondary | ICD-10-CM

## 2013-01-26 LAB — HEPATIC FUNCTION PANEL
AST: 17 U/L (ref 0–37)
Albumin: 4.5 g/dL (ref 3.5–5.2)
Alkaline Phosphatase: 52 U/L (ref 39–117)
Total Bilirubin: 0.4 mg/dL (ref 0.3–1.2)
Total Protein: 6.6 g/dL (ref 6.0–8.3)

## 2013-01-26 LAB — LIPID PANEL
LDL Cholesterol: 82 mg/dL (ref 0–99)
VLDL: 25 mg/dL (ref 0–40)

## 2013-01-26 LAB — HEMOGLOBIN A1C: Hgb A1c MFr Bld: 8.1 % — ABNORMAL HIGH (ref <5.7)

## 2013-01-26 LAB — TSH: TSH: 0.806 u[IU]/mL (ref 0.350–4.500)

## 2013-01-26 MED ORDER — LISINOPRIL 5 MG PO TABS: 5.0000 mg | ORAL_TABLET | Freq: Every day | ORAL | Status: DC

## 2013-01-26 MED ORDER — GLIMEPIRIDE 2 MG PO TABS: 2.0000 mg | ORAL_TABLET | Freq: Every day | ORAL | Status: DC

## 2013-01-26 MED ORDER — SIMVASTATIN 20 MG PO TABS: 20.0000 mg | ORAL_TABLET | Freq: Every evening | ORAL | Status: DC

## 2013-01-26 MED ORDER — ACCU-CHEK FASTCLIX LANCETS MISC: 1.0000 [IU] | Freq: Two times a day (BID) | Status: DC | PRN

## 2013-01-26 MED ORDER — METFORMIN HCL 500 MG PO TABS: 500.0000 mg | ORAL_TABLET | Freq: Two times a day (BID) | ORAL | Status: DC

## 2013-01-26 MED ORDER — MONTELUKAST SODIUM 10 MG PO TABS: 10.0000 mg | ORAL_TABLET | Freq: Every day | ORAL | Status: DC

## 2013-01-26 NOTE — Patient Instructions (Addendum)
Labs  Prior to visit, lipid, renal, cbc, hepatic, hga1c, tsh  Cholesterol Cholesterol is a white, waxy, fat-like protein needed by your body in small amounts. The liver makes all the cholesterol you need. It is carried from the liver by the blood through the blood vessels. Deposits (plaque) may build up on blood vessel walls. This makes the arteries narrower and stiffer. Plaque increases the risk for heart attack and stroke. You cannot feel your cholesterol level even if it is very high. The only way to know is by a blood test to check your lipid (fats) levels. Once you know your cholesterol levels, you should keep a record of the test results. Work with your caregiver to to keep your levels in the desired range. WHAT THE RESULTS MEAN:  Total cholesterol is a rough measure of all the cholesterol in your blood.  LDL is the so-called bad cholesterol. This is the type that deposits cholesterol in the walls of the arteries. You want this level to be low.  HDL is the good cholesterol because it cleans the arteries and carries the LDL away. You want this level to be high.  Triglycerides are fat that the body can either burn for energy or store. High levels are closely linked to heart disease. DESIRED LEVELS:  Total cholesterol below 200.  LDL below 100 for people at risk, below 70 for very high risk.  HDL above 50 is good, above 60 is best.  Triglycerides below 150. HOW TO LOWER YOUR CHOLESTEROL:  Diet.  Choose fish or white meat chicken and [place], roasted or baked. Limit fatty cuts of red meat, fried foods, and processed meats, such as sausage and lunch meat.  Eat lots of fresh fruits and vegetables. Choose whole grains, beans, pasta, potatoes and cereals.  Use only small amounts of olive, corn or canola oils. Avoid butter, mayonnaise, shortening or palm kernel oils. Avoid foods with trans-fats.  Use skim/nonfat milk and low-fat/nonfat yogurt and cheeses. Avoid whole milk, cream, ice  cream, egg yolks and cheeses. Healthy desserts include angel food cake, ginger snaps, animal crackers, hard candy, popsicles, and low-fat/nonfat frozen yogurt. Avoid pastries, cakes, pies and cookies.  Exercise.  A regular program helps decrease LDL and raises HDL.  Helps with weight control.  Do things that increase your activity level like gardening, walking, or taking the stairs.  Medication.  May be prescribed by your caregiver to help lowering cholesterol and the risk for heart disease.  You may need medicine even if your levels are normal if you have several risk factors. HOME CARE INSTRUCTIONS   Follow your diet and exercise programs as suggested by your caregiver.  Take medications as directed.  Have blood work done when your caregiver feels it is necessary. MAKE SURE YOU:   Understand these instructions.  Will watch your condition.  Will get help right away if you are not doing well or get worse. Document Released: 05/07/2001 Document Revised: 11/04/2011 Document Reviewed: 10/28/2007 [facility] Patient Information 2014 [place], [place].

## 2013-01-26 NOTE — Progress Notes (Signed)
Patient ID: Robin Barry, female   DOB: November 18, 1965, 47 y.o.   MRN: [id number] Robin Barry [id number] [birth year] 01/26/2013      Progress Note-Follow Up  Subjective  Chief Complaint  Chief Complaint  Patient presents with  . Follow-up    3 month    HPI  Patient is a 47 year old Caucasian female who is here today in followup. She did have a recent sinus infection but had a good response to her antibiotic and is feeling much better. She denies any fevers or chills. No headache or congestion. No chest pain, palpitations, shortness or breath, GI or GU complaints noted today. She reports her blood sugars have improved and she seen 90s and 100 in the morning. No polyuria or polydipsia. She is taking her medications as prescribed   Past Medical History  Diagnosis Date  . Chicken pox as a child  . Measles as a child  . Allergic rhinitis     ragweed  . GERD (gastroesophageal reflux disease)   . Hypoglycemia 2006  . Anxiety   . Depression   . H/O otitis media     childhood  . Anxiety and depression   . Hemorrhoids 12/26/2011  . Enlarged thyroid 12/26/2011  . Preventative health care 12/26/2011  . Psoriasis 12/26/2011  . Diabetes mellitus type 2, uncontrolled   . Hyperlipidemia 03/23/2012  . Insomnia 03/23/2012  . Sun-damaged skin 06/23/2012  . Migraine 06/23/2012    Past Surgical History  Procedure Laterality Date  . Tubal ligation  1987    Family History  Problem Relation Age of Onset  . COPD Mother     smokes  . Leukemia Mother     chronic lymphatic  . Depression Mother   . Cancer Mother     skin, CLL  . Hypertension Father   . Cancer Father     prostate  . Diabetes Brother 20    Type 1  . Kidney disease Brother     dialysis  . Other Son     brain hemorrhage  . Cancer Maternal Grandmother     breast  . Alzheimer's disease Maternal Grandmother   . Cancer Maternal Grandfather     melanoma  . Cancer Paternal Grandmother     breast  . Emphysema Paternal  Grandmother   . Alcohol abuse Paternal Grandfather   . Cancer Paternal Grandfather     prostate  . Other Maternal Aunt     thalessemia  . Other Maternal Aunt     thalesemia   . Cancer Maternal Aunt     lung  . Other Maternal Aunt     thalesemmia    History   Social History  . Marital Status: Married    Spouse Name: N/A    Number of Children: N/A  . Years of Education: N/A   Occupational History  . Not on file.   Social History Main Topics  . Smoking status: Former Smoker -- 0.50 packs/day for 20 years    Types: Cigarettes    Quit date: 01/24/2010  . Smokeless tobacco: Never Used  . Alcohol Use: Yes     Comment: occasionally a little bit of wine  . Drug Use: No  . Sexually Active: Yes -- Female partner(s)   Other Topics Concern  . Not on file   Social History Narrative  . No narrative on file    Current Outpatient Prescriptions on File Prior to Visit  Medication Sig Dispense Refill  . Blood Glucose Calibration (ACCU-CHEK AVIVA)  SOLN 1 drop by In Vitro route 4 (four) times daily as needed.  1 each  3  . Blood Glucose Monitoring Suppl (ACCU-CHEK AVIVA PLUS) W/DEVICE KIT 1 kit by Does not apply route 4 (four) times daily as needed.  1 kit  0  . cetirizine (ZYRTEC) 10 MG tablet Take 10 mg by mouth 2 (two) times daily.      Marland Kitchen co-enzyme Q-10 30 MG capsule Take 1 capsule (30 mg total) by mouth 2 (two) times daily.      . insulin glargine (LANTUS SOLOSTAR) 100 UNIT/ML injection Inject 12 Units into the skin daily.  5 pen  PRN  . Insulin Pen Needle (BD PEN NEEDLE NANO U/F) 32G X 4 MM MISC As directed  100 each  1  . NON FORMULARY Take 1 each by mouth daily. MEGA RED Multivitamin.      Marland Kitchen omeprazole (PRILOSEC) 10 MG capsule Take 10 mg by mouth daily.      . Probiotic Product (PROBIOTIC PEARLS ADVANTAGE) CAPS Take 1 capsule by mouth daily.      . rizatriptan (MAXALT) 10 MG tablet Take 1 tablet (10 mg total) by mouth as needed for migraine. Only fill if patient requests.  10  tablet  0   No current facility-administered medications on file prior to visit.    Allergies  Allergen Reactions  . Bactrim     rash  . Cephalexin     rash  . Codeine     hallucinations  . Terramycin     Review of Systems  Review of Systems  Constitutional: Negative for fever and malaise/fatigue.  HENT: Negative for congestion.   Eyes: Negative for discharge.  Respiratory: Negative for shortness of breath.   Cardiovascular: Negative for chest pain, palpitations and leg swelling.  Gastrointestinal: Negative for nausea, abdominal pain and diarrhea.  Genitourinary: Negative for dysuria.  Musculoskeletal: Negative for falls.  Skin: Negative for rash.  Neurological: Negative for loss of consciousness and headaches.  Endo/Heme/Allergies: Negative for polydipsia.  Psychiatric/Behavioral: Negative for depression and suicidal ideas. The patient is not nervous/anxious and does not have insomnia.     Objective  BP 122/78  Pulse 85  Temp(Src) 98 F (36.7 C) (Oral)  Ht 5' 0.25" (1.53 m)  Wt 138 lb 1.9 oz (62.651 kg)  BMI 26.76 kg/m2  SpO2 96%  LMP 12/15/2012  Physical Exam  Physical Exam  Constitutional: She is oriented to person, place, and time and well-developed, well-nourished, and in no distress. No distress.  HENT:  Head: Normocephalic and atraumatic.  Eyes: Conjunctivae are normal.  Neck: Neck supple. No thyromegaly present.  Cardiovascular: Normal rate, regular rhythm and normal heart sounds.   No murmur heard. Pulmonary/Chest: Effort normal and breath sounds normal. She has no wheezes.  Abdominal: She exhibits no distension and no mass.  Musculoskeletal: She exhibits no edema.  Lymphadenopathy:    She has no cervical adenopathy.  Neurological: She is alert and oriented to person, place, and time.  Skin: Skin is warm and dry. No rash noted. She is not diaphoretic.  Psychiatric: Memory, affect and judgment normal.    Lab Results  Component Value Date    TSH 1.267 10/27/2012   Lab Results  Component Value Date   WBC 6.3 10/27/2012   HGB 13.6 10/27/2012   HCT 40.1 10/27/2012   MCV 87.4 10/27/2012   PLT 315 10/27/2012   Lab Results  Component Value Date   CREATININE 0.72 10/27/2012   BUN 10 10/27/2012  NA 140 10/27/2012   K 4.0 10/27/2012   CL 103 10/27/2012   CO2 27 10/27/2012   Lab Results  Component Value Date   ALT 11 10/27/2012   AST 13 10/27/2012   ALKPHOS 54 10/27/2012   BILITOT 0.5 10/27/2012   Lab Results  Component Value Date   CHOL 237* 10/27/2012   Lab Results  Component Value Date   HDL 55 10/27/2012   Lab Results  Component Value Date   LDLCALC 156* 10/27/2012   Lab Results  Component Value Date   TRIG 131 10/27/2012   Lab Results  Component Value Date   CHOLHDL 4.3 10/27/2012     Assessment & Plan  Diabetes mellitus type 2, uncontrolled Avoid simple carbs, consider increasing Lantus by 2 units  Hyperlipidemia Tolerating Zocor, avoid trans fats, add krill oil caps  Sinusitis, acute Resolved with Zpak  GERD (gastroesophageal reflux disease) Well controlled on Omeprazole

## 2013-01-31 NOTE — Assessment & Plan Note (Signed)
Avoid simple carbs, consider increasing Lantus by 2 units

## 2013-01-31 NOTE — Assessment & Plan Note (Signed)
Resolved with Zpak

## 2013-01-31 NOTE — Assessment & Plan Note (Signed)
Well controlled on Omeprazole

## 2013-01-31 NOTE — Assessment & Plan Note (Signed)
Tolerating Zocor, avoid trans fats, add krill oil caps

## 2013-02-24 ENCOUNTER — Telehealth: Payer: Self-pay

## 2013-02-24 MED ORDER — ACCU-CHEK FASTCLIX LANCETS MISC: 1.0000 [IU] | Freq: Three times a day (TID) | Status: DC | PRN

## 2013-02-24 NOTE — Telephone Encounter (Signed)
RX sent

## 2013-03-30 ENCOUNTER — Telehealth: Payer: Self-pay | Admitting: Family Medicine

## 2013-03-30 NOTE — Telephone Encounter (Signed)
Patient states that she has had menstrual bleeding since 03/21/13 and thinks she has a bladder infection. Patient says that she is two hours from our office and wants to know what Dr. Abner Greenspan recommend she do? Patient wants to know if she should go to UC or be seen here?

## 2013-03-30 NOTE — Telephone Encounter (Signed)
Pt states she will try an urgent care close to her since MD is off tomorrow and if she has no luck she will call us tomorrow to see if she can be seen

## 2013-03-30 NOTE — Telephone Encounter (Signed)
Please advise 

## 2013-03-30 NOTE — Telephone Encounter (Signed)
So for the uti and bleeding she certainly could go to an urgent care and have them at least check a cbc and urinalysis because these are generally easy to treat. She is always welcome to come in here

## 2013-04-13 ENCOUNTER — Ambulatory Visit (HOSPITAL_BASED_OUTPATIENT_CLINIC_OR_DEPARTMENT_OTHER)
Admission: RE | Admit: 2013-04-13 | Discharge: 2013-04-13 | Disposition: A | Discharge status: Home / Self Care | Payer: BC Managed Care – PPO | Source: Ambulatory Visit | Attending: Family | Admitting: Family

## 2013-04-13 ENCOUNTER — Ambulatory Visit (INDEPENDENT_AMBULATORY_CARE_PROVIDER_SITE_OTHER): Payer: BC Managed Care – PPO | Admitting: Family

## 2013-04-13 ENCOUNTER — Encounter: Payer: Self-pay | Admitting: Family

## 2013-04-13 ENCOUNTER — Telehealth: Payer: Self-pay | Admitting: Family

## 2013-04-13 VITALS — BP 102/72 | HR 87 | Temp 97.8°F | Ht 63.0 in | Wt 139.0 lb

## 2013-04-13 DIAGNOSIS — M545 Low back pain, unspecified: Secondary | ICD-10-CM

## 2013-04-13 DIAGNOSIS — Z889 Allergy status to unspecified drugs, medicaments and biological substances status: Secondary | ICD-10-CM

## 2013-04-13 DIAGNOSIS — R3129 Other microscopic hematuria: Secondary | ICD-10-CM | POA: Insufficient documentation

## 2013-04-13 DIAGNOSIS — J069 Acute upper respiratory infection, unspecified: Secondary | ICD-10-CM

## 2013-04-13 DIAGNOSIS — R319 Hematuria, unspecified: Secondary | ICD-10-CM

## 2013-04-13 DIAGNOSIS — Z9109 Other allergy status, other than to drugs and biological substances: Secondary | ICD-10-CM

## 2013-04-13 DIAGNOSIS — R109 Unspecified abdominal pain: Secondary | ICD-10-CM | POA: Insufficient documentation

## 2013-04-13 LAB — POCT URINALYSIS DIPSTICK
Nitrite, UA: NEGATIVE
Spec Grav, UA: 1.01
Urobilinogen, UA: 0.2
pH, UA: 5

## 2013-04-13 MED ORDER — CIPROFLOXACIN HCL 500 MG PO TABS: 500.0000 mg | ORAL_TABLET | Freq: Two times a day (BID) | ORAL | Status: DC

## 2013-04-13 MED ORDER — EPINEPHRINE 0.3 MG/0.3ML IJ SOAJ: INTRAMUSCULAR | Status: AC

## 2013-04-13 NOTE — Telephone Encounter (Signed)
Notified pt. 

## 2013-04-13 NOTE — Patient Instructions (Addendum)
Please complete your CT scan on the first floor. We will contact you with your results.

## 2013-04-13 NOTE — Assessment & Plan Note (Signed)
Allergist provided her with an epi pen in the past which has expired. She had allergy testing which was negative except for rag weed per pt.  She reports no further episodes of allergic reaction.  Refill provided today.

## 2013-04-13 NOTE — Assessment & Plan Note (Addendum)
UA notable for large blood despite pt's menstrual cycle ending several weeks ago. 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  Will send urine for culture and obtain CT abd/pelvis to rule out stone.  Further recommendations pending CT result.    Addendum- ct neg for stone, plan empiric rx with cipro pending urine culture results. Pt will need a follow up urinalysis in September at her apt to ensure resolution of hematuria.

## 2013-04-13 NOTE — Assessment & Plan Note (Signed)
Symptoms most consistent with viral uri.  Recommended tylenol prn comfort and that she start valtrex for oral lesions.

## 2013-04-13 NOTE — Telephone Encounter (Signed)
Reviewed CT scan- negative for stone.  Will plan empiric rx with cipro pending culture results.

## 2013-04-13 NOTE — Addendum Note (Signed)
Addended by: Sandford Craze on: 04/13/2013 02:16 PM   Modules accepted: Orders

## 2013-04-13 NOTE — Progress Notes (Signed)
Subjective:    Patient ID: Robin Barry, female    DOB: Feb 16, 1966, 47 y.o.   MRN: [id number]  HPI  Robin Barry is a 47 yr old female who presents today for follow up. She reports that she did have a menstrual cycle which lasted 3 weeks. Went to see a doctor near her home. Was told that she had glucose and blood in the urine.  She was placed on macrobid for UTI and this was sent back . She denies current dysuria or fever. She does report + fatigue for the past 3-4 weeks.    Reports menstrual periods have been irregular.  Period has stopped.   She continues to have some lower back pain/spasms.    Husband has been sick with UTI. Reports that she has had fever blisters, dry hacking cough. She reports that the cough started about 3 days ago. Fever blisters started late yesterday afternoon. She has valtrex at home but has not yet started.    Pt report that she has an expired epi pen would like to have refilled.  Had episode of severe allergic reaction in [place].    Review of Systems    see HPI  Past Medical History  Diagnosis Date  . Chicken pox as a child  . Measles as a child  . Allergic rhinitis     ragweed  . GERD (gastroesophageal reflux disease)   . Hypoglycemia 2006  . Anxiety   . Depression   . H/O otitis media     childhood  . Anxiety and depression   . Hemorrhoids 12/26/2011  . Enlarged thyroid 12/26/2011  . Preventative health care 12/26/2011  . Psoriasis 12/26/2011  . Diabetes mellitus type 2, uncontrolled   . Hyperlipidemia 03/23/2012  . Insomnia 03/23/2012  . Sun-damaged skin 06/23/2012  . Migraine 06/23/2012    History   Social History  . Marital Status: Married    Spouse Name: N/A    Number of Children: N/A  . Years of Education: N/A   Occupational History  . Not on file.   Social History Main Topics  . Smoking status: Former Smoker -- 0.50 packs/day for 20 years    Types: Cigarettes    Quit date: 01/24/2010  . Smokeless tobacco: Never Used  . Alcohol Use:  Yes     Comment: occasionally a little bit of wine  . Drug Use: No  . Sexual Activity: Yes    Partners: Male   Other Topics Concern  . Not on file   Social History Narrative  . No narrative on file    Past Surgical History  Procedure Laterality Date  . Tubal ligation  1987    Family History  Problem Relation Age of Onset  . COPD Mother     smokes  . Leukemia Mother     chronic lymphatic  . Depression Mother   . Cancer Mother     skin, CLL  . Hypertension Father   . Cancer Father     prostate  . Diabetes Brother 20    Type 1  . Kidney disease Brother     dialysis  . Other Son     brain hemorrhage  . Cancer Maternal Grandmother     breast  . Alzheimer's disease Maternal Grandmother   . Cancer Maternal Grandfather     melanoma  . Cancer Paternal Grandmother     breast  . Emphysema Paternal Grandmother   . Alcohol abuse Paternal Grandfather   . Cancer Paternal  Grandfather     prostate  . Other Maternal Aunt     thalessemia  . Other Maternal Aunt     thalesemia   . Cancer Maternal Aunt     lung  . Other Maternal Aunt     thalesemmia    Allergies  Allergen Reactions  . Bactrim     rash  . Cephalexin     rash  . Codeine     hallucinations  . Terramycin     Current Outpatient Prescriptions on File Prior to Visit  Medication Sig Dispense Refill  . ACCU-CHEK FASTCLIX LANCETS MISC 1 Units by Does not apply route 3 (three) times daily as needed. DX: 250.00  306 each  2  . Blood Glucose Calibration (ACCU-CHEK AVIVA) SOLN 1 drop by In Vitro route 4 (four) times daily as needed.  1 each  3  . Blood Glucose Monitoring Suppl (ACCU-CHEK AVIVA PLUS) W/DEVICE KIT 1 kit by Does not apply route 4 (four) times daily as needed.  1 kit  0  . cetirizine (ZYRTEC) 10 MG tablet Take 10 mg by mouth 2 (two) times daily.      Marland Kitchen co-enzyme Q-10 30 MG capsule Take 1 capsule (30 mg total) by mouth 2 (two) times daily.      Marland Kitchen glimepiride (AMARYL) 2 MG tablet Take 1 tablet (2 mg  total) by mouth daily before breakfast.  90 tablet  2  . insulin glargine (LANTUS SOLOSTAR) 100 UNIT/ML injection Inject 12 Units into the skin daily.  5 pen  PRN  . Insulin Pen Needle (BD PEN NEEDLE NANO U/F) 32G X 4 MM MISC As directed  100 each  1  . lisinopril (PRINIVIL,ZESTRIL) 5 MG tablet Take 1 tablet (5 mg total) by mouth daily.  90 tablet  2  . metFORMIN (GLUCOPHAGE) 500 MG tablet Take 1 tablet (500 mg total) by mouth 2 (two) times daily with a meal.  180 tablet  2  . montelukast (SINGULAIR) 10 MG tablet Take 1 tablet (10 mg total) by mouth at bedtime.  90 tablet  2  . NON FORMULARY Take 1 each by mouth daily. MEGA RED Multivitamin.      Marland Kitchen omeprazole (PRILOSEC) 10 MG capsule Take 10 mg by mouth daily.      . Probiotic Product (PROBIOTIC PEARLS ADVANTAGE) CAPS Take 1 capsule by mouth daily.      . rizatriptan (MAXALT) 10 MG tablet Take 1 tablet (10 mg total) by mouth as needed for migraine. Only fill if patient requests.  10 tablet  0  . simvastatin (ZOCOR) 20 MG tablet Take 1 tablet (20 mg total) by mouth every evening.  90 tablet  2   No current facility-administered medications on file prior to visit.    BP 102/72  Pulse 87  Temp(Src) 97.8 F (36.6 C) (Oral)  Ht 5\' 3"  (1.6 m)  Wt 139 lb (63.05 kg)  BMI 24.63 kg/m2  SpO2 95%  LMP 03/21/2013    Objective:   Physical Exam  Constitutional: She is oriented to person, place, and time. She appears well-developed and well-nourished. No distress.  Cardiovascular: Normal rate and regular rhythm.   No murmur heard. Pulmonary/Chest: Effort normal and breath sounds normal. No respiratory distress. She has no wheezes. She has no rales. She exhibits no tenderness.  Abdominal: Soft. Bowel sounds are normal. She exhibits no distension and no mass. There is no tenderness. There is no rebound and no guarding.  Genitourinary:  Bilateral CVAT R>L  Musculoskeletal: She exhibits no edema.  Mild right lower back tenderness to palpation.    Neurological: She is alert and oriented to person, place, and time.  Psychiatric: She has a normal mood and affect. Her behavior is normal. Judgment and thought content normal.          Assessment & Plan:

## 2013-04-14 ENCOUNTER — Telehealth: Payer: Self-pay | Admitting: *Deleted

## 2013-04-14 NOTE — Telephone Encounter (Signed)
Received call from Danielle at [facility] requesting clarification of urinalysis that was ordered on 04/13/13 and specimen received was for a urine culture. Upon review of office note advised her that test should be for a urine culture. She will correct in the system.

## 2013-04-15 LAB — URINE CULTURE

## 2013-04-17 ENCOUNTER — Encounter: Payer: Self-pay | Admitting: Family

## 2013-05-06 IMAGING — US US SOFT TISSUE HEAD/NECK
1 series · 14 of 18 positions shown · non-contrast
Comparison: No comparison studies available.

CLINICAL DATA: Thyromegaly.

THYROID ULTRASOUND
TECHNIQUE: Ultrasound examination of the thyroid gland and
adjacent soft tissues was performed.

[Series 1: us soft tissue head/neck · 0.08mm/px · 14 of 18 slices shown]
[im 1/18]
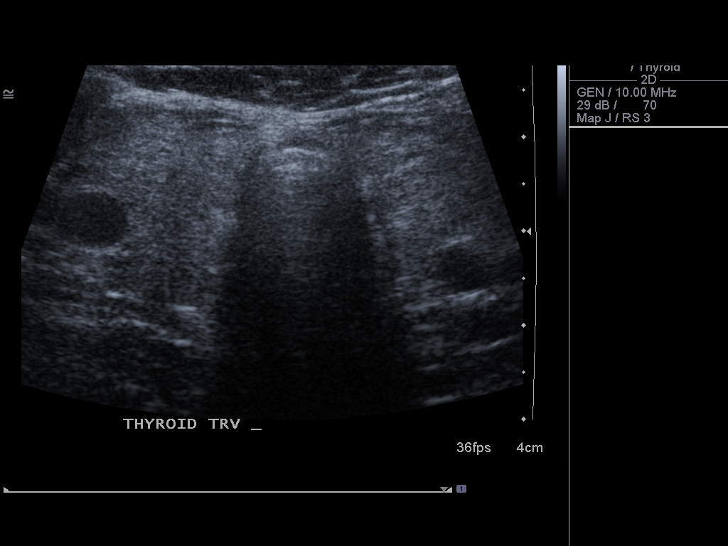
[im 2/18]
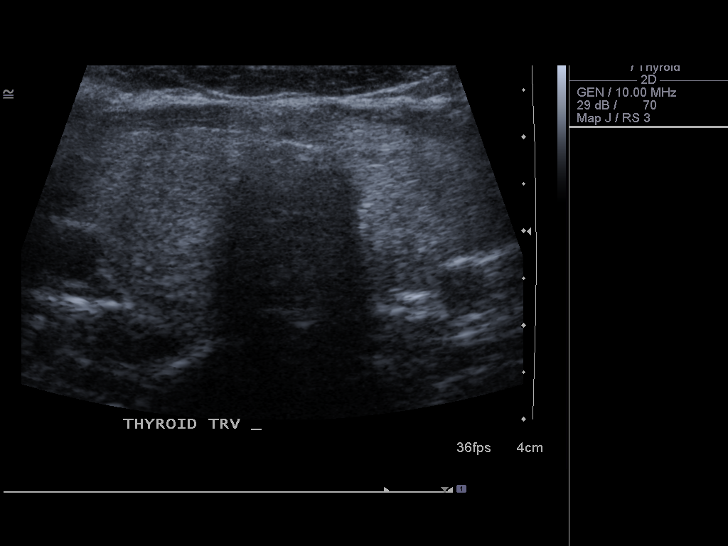
[im 4/18]
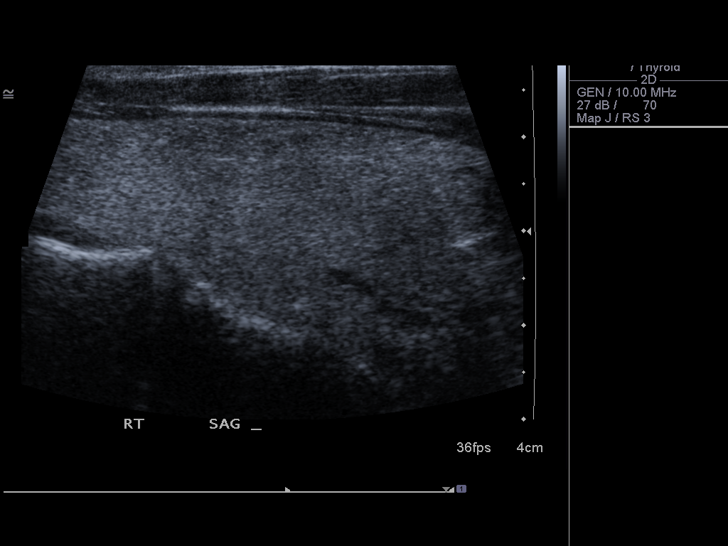
[im 5/18]
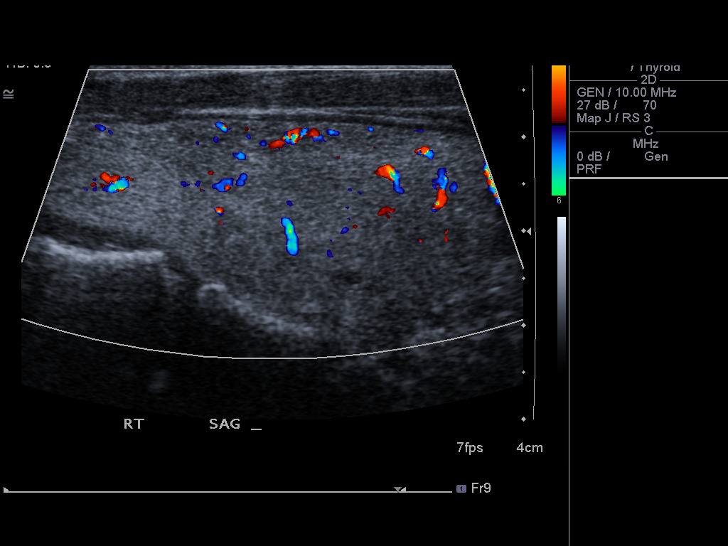
[im 6/18]
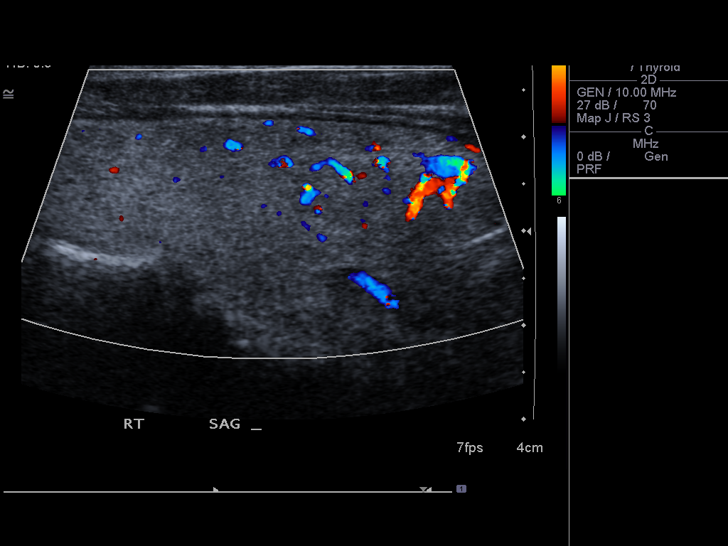
[im 8/18]
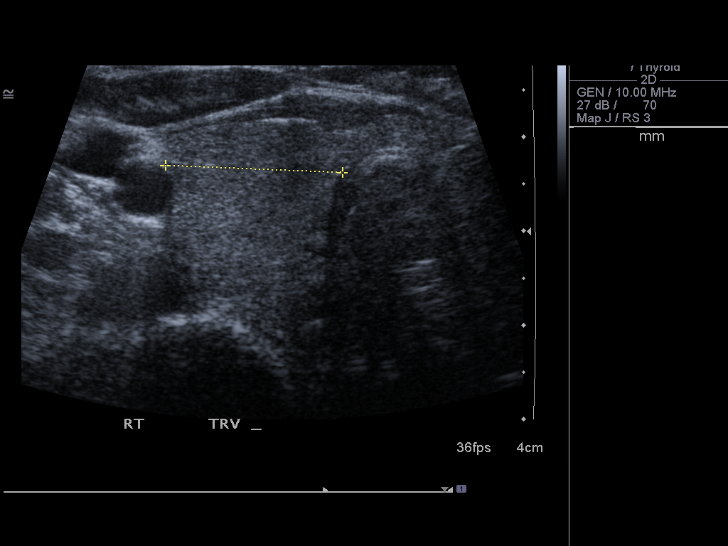
[im 9/18]
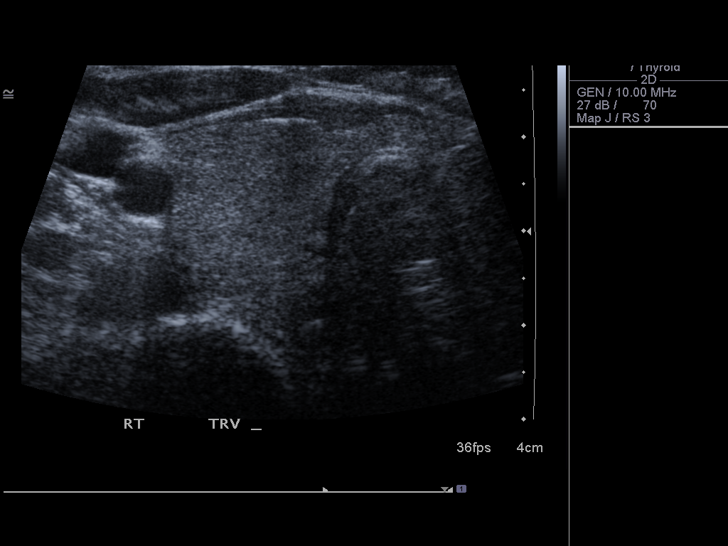
[im 10/18]
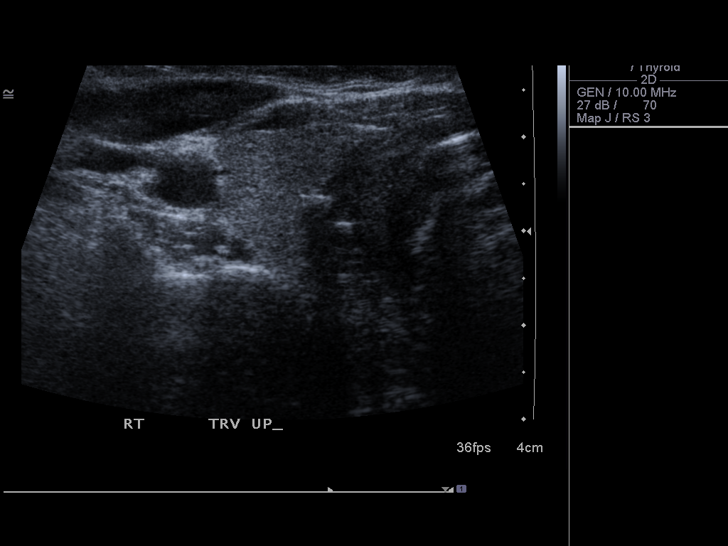
[im 11/18]
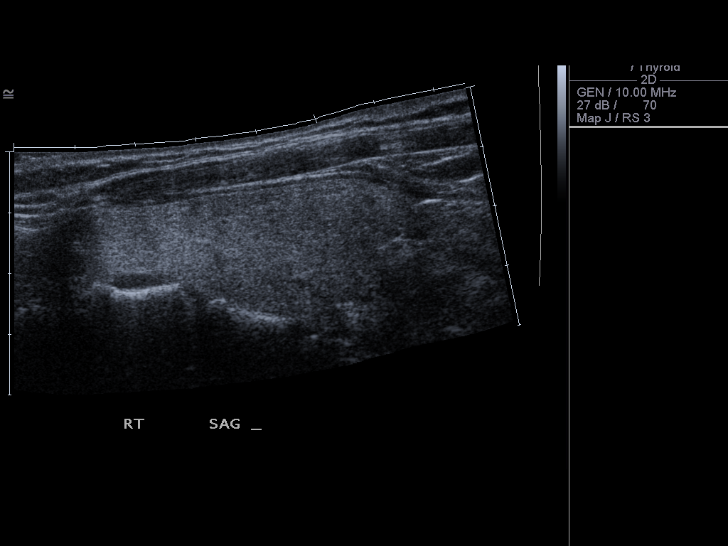
[im 13/18]
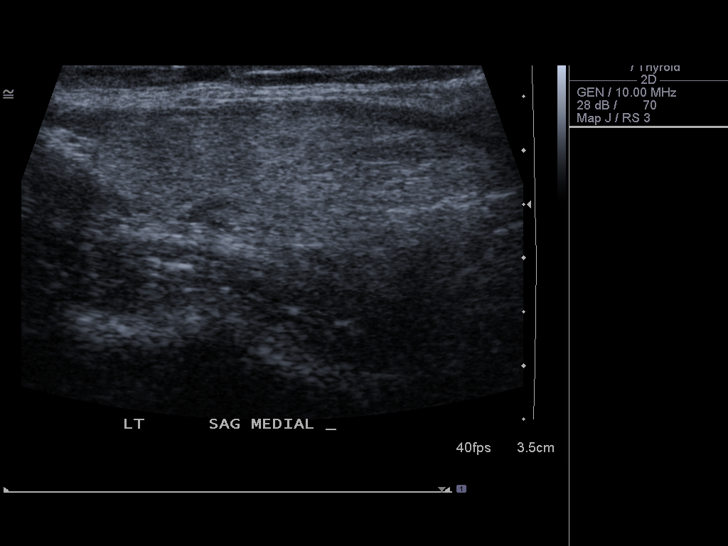
[im 14/18]
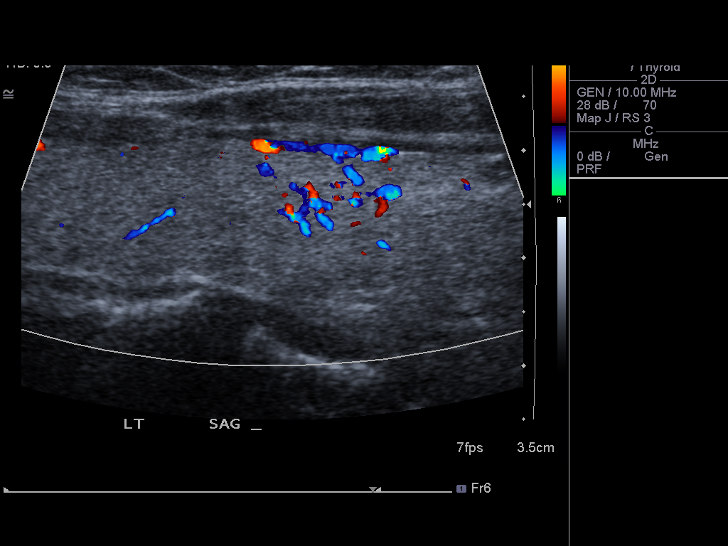
[im 15/18]
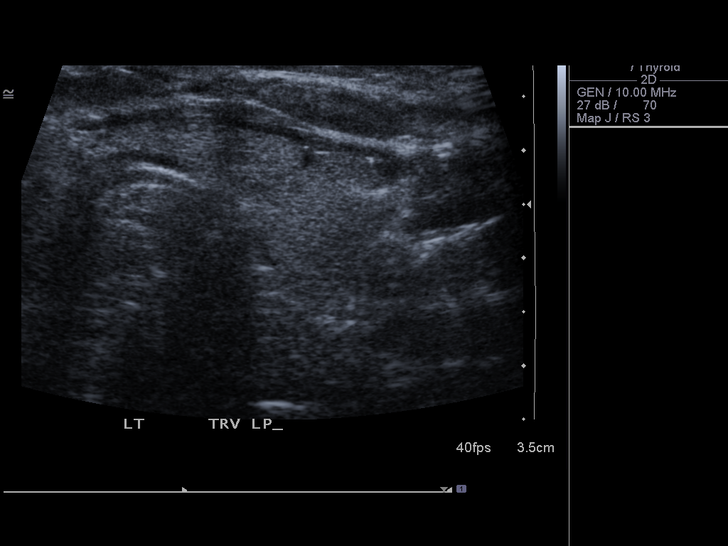
[im 17/18]
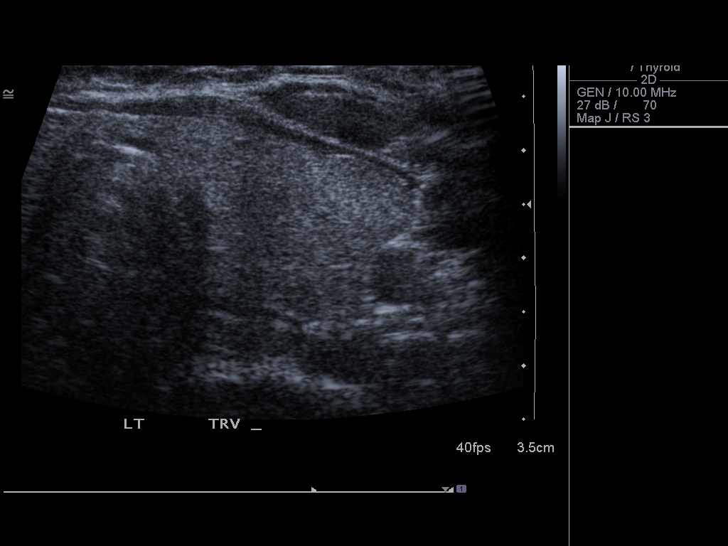
[im 18/18]
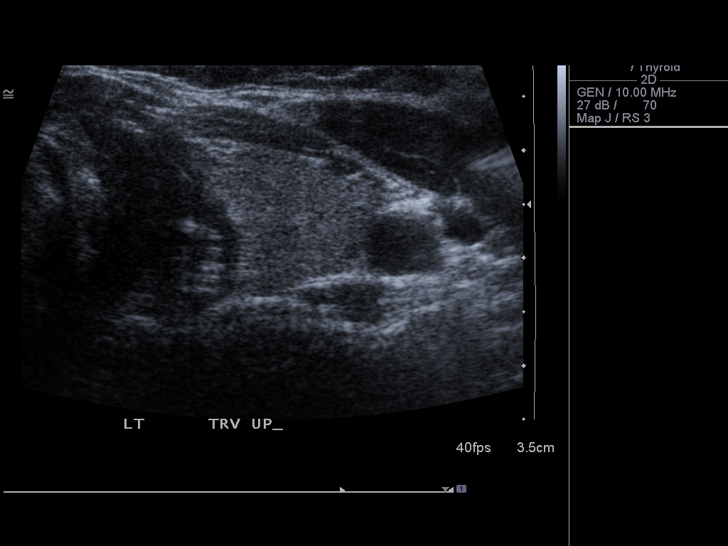

[14 of 18 positions shown; findings below may reference images not displayed]

FINDINGS: The right thyroid lobe measures 5.6 x 2.2 x 1.9 cm.  The left lobe
measures 5.2 x 1.4 x 2.0 cm.  The isthmus is 2 mm in thickness.

Thyroid parenchyma is homogeneous.  There is no focal thyroid
nodule.  No regional lymphadenopathy is evident.
IMPRESSION: Normal ultrasound evaluation of the thyroid.

## 2013-05-17 ENCOUNTER — Telehealth: Payer: Self-pay

## 2013-05-17 DIAGNOSIS — E1165 Type 2 diabetes mellitus with hyperglycemia: Secondary | ICD-10-CM

## 2013-05-17 DIAGNOSIS — E785 Hyperlipidemia, unspecified: Secondary | ICD-10-CM

## 2013-05-17 DIAGNOSIS — Z0000 Encounter for general adult medical examination without abnormal findings: Secondary | ICD-10-CM

## 2013-05-17 LAB — CBC
HCT: 39.3 % (ref 36.0–46.0)
Hemoglobin: 13.2 g/dL (ref 12.0–15.0)
MCH: 29.3 pg (ref 26.0–34.0)
MCHC: 33.6 g/dL (ref 30.0–36.0)
MCV: 87.3 fL (ref 78.0–100.0)

## 2013-05-17 NOTE — Telephone Encounter (Signed)
Lab order placed.

## 2013-05-18 LAB — LIPID PANEL
Cholesterol: 163 mg/dL (ref 0–200)
Total CHOL/HDL Ratio: 3.2 Ratio

## 2013-05-18 LAB — HEPATIC FUNCTION PANEL
ALT: 15 U/L (ref 0–35)
AST: 14 U/L (ref 0–37)
Albumin: 4.4 g/dL (ref 3.5–5.2)
Total Bilirubin: 0.3 mg/dL (ref 0.3–1.2)
Total Protein: 7.1 g/dL (ref 6.0–8.3)

## 2013-05-18 LAB — RENAL FUNCTION PANEL
BUN: 15 mg/dL (ref 6–23)
CO2: 27 mEq/L (ref 19–32)
Calcium: 9.8 mg/dL (ref 8.4–10.5)
Glucose, Bld: 184 mg/dL — ABNORMAL HIGH (ref 70–99)
Potassium: 4.6 mEq/L (ref 3.5–5.3)

## 2013-05-18 LAB — HEMOGLOBIN A1C
Hgb A1c MFr Bld: 10.1 % — ABNORMAL HIGH
Mean Plasma Glucose: 243 mg/dL — ABNORMAL HIGH

## 2013-05-18 LAB — TSH: TSH: 0.984 u[IU]/mL (ref 0.350–4.500)

## 2013-05-20 ENCOUNTER — Encounter: Payer: Self-pay | Admitting: Family Medicine

## 2013-05-20 ENCOUNTER — Telehealth: Payer: Self-pay | Admitting: Family Medicine

## 2013-05-20 ENCOUNTER — Ambulatory Visit (INDEPENDENT_AMBULATORY_CARE_PROVIDER_SITE_OTHER): Payer: BC Managed Care – PPO | Admitting: Family Medicine

## 2013-05-20 VITALS — BP 102/74 | HR 88 | Temp 97.4°F | Ht 63.0 in | Wt 137.0 lb

## 2013-05-20 DIAGNOSIS — E1165 Type 2 diabetes mellitus with hyperglycemia: Secondary | ICD-10-CM

## 2013-05-20 DIAGNOSIS — K219 Gastro-esophageal reflux disease without esophagitis: Secondary | ICD-10-CM

## 2013-05-20 DIAGNOSIS — F341 Dysthymic disorder: Secondary | ICD-10-CM

## 2013-05-20 DIAGNOSIS — F329 Major depressive disorder, single episode, unspecified: Secondary | ICD-10-CM

## 2013-05-20 DIAGNOSIS — IMO0002 Reserved for concepts with insufficient information to code with codable children: Secondary | ICD-10-CM

## 2013-05-20 DIAGNOSIS — Z0000 Encounter for general adult medical examination without abnormal findings: Secondary | ICD-10-CM

## 2013-05-20 DIAGNOSIS — Z23 Encounter for immunization: Secondary | ICD-10-CM

## 2013-05-20 DIAGNOSIS — E785 Hyperlipidemia, unspecified: Secondary | ICD-10-CM

## 2013-05-20 DIAGNOSIS — R319 Hematuria, unspecified: Secondary | ICD-10-CM

## 2013-05-20 MED ORDER — SIMVASTATIN 20 MG PO TABS: 20.0000 mg | ORAL_TABLET | Freq: Every evening | ORAL | Status: DC

## 2013-05-20 MED ORDER — INSULIN PEN NEEDLE 32G X 4 MM MISC: Status: DC

## 2013-05-20 NOTE — Patient Instructions (Addendum)
Hematuria, Adult Hematuria (blood in your urine) can be caused by a bladder infection (cystitis), kidney infection (pyelonephritis), prostate infection (prostatitis), or kidney stone. Infections will usually respond to antibiotics (medications which kill germs), and a kidney stone will usually pass through your urine without further treatment. If you were put on antibiotics, take all the medicine until gone. You may feel better in a few days, but take all of your medicine or the infection may not respond and become more difficult to treat. If antibiotics were not given, an infection did not cause the blood in the urine. A further work up to find out the reason may be needed. HOME CARE INSTRUCTIONS   Drink lots of fluid, 3 to 4 quarts a day. If you have been diagnosed with an infection, cranberry juice is especially recommended, in addition to large amounts of water.  Avoid caffeine, tea, and carbonated beverages, because they tend to irritate the bladder.  Avoid alcohol as it may irritate the prostate.  Only take over-the-counter or prescription medicines for pain, discomfort, or fever as directed by your caregiver.  If you have been diagnosed with a kidney stone follow your caregivers instructions regarding straining your urine to catch the stone. TO PREVENT FURTHER INFECTIONS:  Empty the bladder often. Avoid holding urine for long periods of time.  After a bowel movement, women should cleanse front to back. Use each tissue only once.  Empty the bladder before and after sexual intercourse if you are a female.  Return to your caregiver if you develop back pain, fever, nausea (feeling sick to your stomach), vomiting, or your symptoms (problems) are not better in 3 days. Return sooner if you are getting worse. If you have been requested to return for further testing make sure to keep your appointments. If an infection is not the cause of blood in your urine, X-rays may be required. Your caregiver  will discuss this with you. SEEK IMMEDIATE MEDICAL CARE IF:   You have a persistent fever over 102 F (38.9 C).  You develop severe vomiting and are unable to keep the medication down.  You develop severe back or abdominal pain despite taking your medications.  You begin passing a large amount of blood or clots in your urine.  You feel extremely weak or faint, or pass out. MAKE SURE YOU:   Understand these instructions.  Will watch your condition.  Will get help right away if you are not doing well or get worse. Document Released: 08/12/2005 Document Revised: 11/04/2011 Document Reviewed: 03/31/2008 Exit[employer] Patient Information 2014 [employer].  

## 2013-05-20 NOTE — Progress Notes (Signed)
Patient ID: Robin Barry, female   DOB: 1966/01/18, 47 y.o.   MRN: [id number] Robin Barry [id number] [birth year] 05/20/2013      Progress Note-Follow Up  Subjective  Chief Complaint  Chief Complaint  Patient presents with  . Follow-up    3 month  . Injections    flu    HPI  Patient is a 47 year old Caucasian female who is in today in followup. Overall is feeling well. Doesn't persistent fatigue but otherwise reports she's been trying to eat better and is exercising regularly. She notes a little split in the last month has been 140 and highest to 230. Denies polyuria and polydipsia. Agrees to flu shot today. No chest pain, palpitations, fevers, recent illness, GI or GU concerns noted today.  Past Medical History  Diagnosis Date  . Chicken pox as a child  . Measles as a child  . Allergic rhinitis     ragweed  . GERD (gastroesophageal reflux disease)   . Hypoglycemia 2006  . Anxiety   . Depression   . H/O otitis media     childhood  . Anxiety and depression   . Hemorrhoids 12/26/2011  . Enlarged thyroid 12/26/2011  . Preventative health care 12/26/2011  . Psoriasis 12/26/2011  . Diabetes mellitus type 2, uncontrolled   . Hyperlipidemia 03/23/2012  . Insomnia 03/23/2012  . Sun-damaged skin 06/23/2012  . Migraine 06/23/2012    Past Surgical History  Procedure Laterality Date  . Tubal ligation  1987    Family History  Problem Relation Age of Onset  . COPD Mother     smokes  . Leukemia Mother     chronic lymphatic  . Depression Mother   . Cancer Mother     skin, CLL  . Hypertension Father   . Cancer Father     prostate  . Diabetes Brother 20    Type 1  . Kidney disease Brother     dialysis  . Other Son     brain hemorrhage  . Cancer Maternal Grandmother     breast  . Alzheimer's disease Maternal Grandmother   . Cancer Maternal Grandfather     melanoma  . Cancer Paternal Grandmother     breast  . Emphysema Paternal Grandmother   . Alcohol abuse Paternal  Grandfather   . Cancer Paternal Grandfather     prostate  . Other Maternal Aunt     thalessemia  . Other Maternal Aunt     thalesemia   . Cancer Maternal Aunt     lung  . Other Maternal Aunt     thalesemmia    History   Social History  . Marital Status: Married    Spouse Name: N/A    Number of Children: N/A  . Years of Education: N/A   Occupational History  . Not on file.   Social History Main Topics  . Smoking status: Former Smoker -- 0.50 packs/day for 20 years    Types: Cigarettes    Quit date: 01/24/2010  . Smokeless tobacco: Never Used  . Alcohol Use: Yes     Comment: occasionally a little bit of wine  . Drug Use: No  . Sexual Activity: Yes    Partners: Male   Other Topics Concern  . Not on file   Social History Narrative  . No narrative on file    Current Outpatient Prescriptions on File Prior to Visit  Medication Sig Dispense Refill  . ACCU-CHEK FASTCLIX LANCETS MISC 1 Units by Does not  apply route 3 (three) times daily as needed. DX: 250.00  306 each  2  . ALPRAZolam (XANAX) 0.25 MG tablet Take 1 tablet (0.25 mg total) by mouth 2 (two) times daily as needed for sleep or anxiety.  40 tablet  1  . Blood Glucose Calibration (ACCU-CHEK AVIVA) SOLN 1 drop by In Vitro route 4 (four) times daily as needed.  1 each  3  . Blood Glucose Monitoring Suppl (ACCU-CHEK AVIVA PLUS) W/DEVICE KIT 1 kit by Does not apply route 4 (four) times daily as needed.  1 kit  0  . cetirizine (ZYRTEC) 10 MG tablet Take 10 mg by mouth 2 (two) times daily.      Marland Kitchen co-enzyme Q-10 30 MG capsule Take 1 capsule (30 mg total) by mouth 2 (two) times daily.      Marland Kitchen EPINEPHrine (EPIPEN) 0.3 mg/0.3 mL SOAJ injection Inject into muscle in case of severe allergic reaction then proceed directly to the ER.  2 Device  0  . glimepiride (AMARYL) 2 MG tablet Take 1 tablet (2 mg total) by mouth daily before breakfast.  90 tablet  2  . insulin glargine (LANTUS SOLOSTAR) 100 UNIT/ML injection Inject 12 Units  into the skin daily.  5 pen  PRN  . lisinopril (PRINIVIL,ZESTRIL) 5 MG tablet Take 1 tablet (5 mg total) by mouth daily.  90 tablet  2  . metFORMIN (GLUCOPHAGE) 500 MG tablet Take 1 tablet (500 mg total) by mouth 2 (two) times daily with a meal.  180 tablet  2  . montelukast (SINGULAIR) 10 MG tablet Take 1 tablet (10 mg total) by mouth at bedtime.  90 tablet  2  . NON FORMULARY Take 1 each by mouth daily. MEGA RED Multivitamin.      Marland Kitchen omeprazole (PRILOSEC) 10 MG capsule Take 10 mg by mouth daily.      . Probiotic Product (PROBIOTIC PEARLS ADVANTAGE) CAPS Take 1 capsule by mouth daily.      . rizatriptan (MAXALT) 10 MG tablet Take 1 tablet (10 mg total) by mouth as needed for migraine. Only fill if patient requests.  10 tablet  0   No current facility-administered medications on file prior to visit.    Allergies  Allergen Reactions  . Bactrim     rash  . Cephalexin     rash  . Codeine     hallucinations  . Terramycin     Review of Systems  Review of Systems  Constitutional: Negative for fever and malaise/fatigue.  HENT: Negative for congestion.   Eyes: Negative for discharge.  Respiratory: Negative for shortness of breath.   Cardiovascular: Negative for chest pain, palpitations and leg swelling.  Gastrointestinal: Negative for nausea, abdominal pain and diarrhea.  Genitourinary: Negative for dysuria.  Musculoskeletal: Negative for falls.  Skin: Negative for rash.  Neurological: Negative for loss of consciousness and headaches.  Endo/Heme/Allergies: Negative for polydipsia.  Psychiatric/Behavioral: Negative for depression and suicidal ideas. The patient is not nervous/anxious and does not have insomnia.     Objective  BP 102/74  Pulse 88  Temp(Src) 97.4 F (36.3 C) (Oral)  Ht 5\' 3"  (1.6 m)  Wt 137 lb (62.143 kg)  BMI 24.27 kg/m2  SpO2 94%  LMP 03/21/2013  Physical Exam  Physical Exam  Constitutional: She is oriented to person, place, and time and well-developed,  well-nourished, and in no distress. No distress.  HENT:  Head: Normocephalic and atraumatic.  Eyes: Conjunctivae are normal.  Neck: Neck supple. No thyromegaly present.  Cardiovascular: Normal rate, regular rhythm and normal heart sounds.   No murmur heard. Pulmonary/Chest: Effort normal and breath sounds normal. She has no wheezes.  Abdominal: She exhibits no distension and no mass.  Musculoskeletal: She exhibits no edema.  Lymphadenopathy:    She has no cervical adenopathy.  Neurological: She is alert and oriented to person, place, and time.  Skin: Skin is warm and dry. No rash noted. She is not diaphoretic.  Psychiatric: Memory, affect and judgment normal.    Lab Results  Component Value Date   TSH 0.984 05/17/2013   Lab Results  Component Value Date   WBC 5.6 05/17/2013   HGB 13.2 05/17/2013   HCT 39.3 05/17/2013   MCV 87.3 05/17/2013   PLT 308 05/17/2013   Lab Results  Component Value Date   CREATININE 0.75 05/17/2013   BUN 15 05/17/2013   NA 136 05/17/2013   K 4.6 05/17/2013   CL 102 05/17/2013   CO2 27 05/17/2013   Lab Results  Component Value Date   ALT 15 05/17/2013   AST 14 05/17/2013   ALKPHOS 55 05/17/2013   BILITOT 0.3 05/17/2013   Lab Results  Component Value Date   CHOL 163 05/17/2013   Lab Results  Component Value Date   HDL 51 05/17/2013   Lab Results  Component Value Date   LDLCALC 88 05/17/2013   Lab Results  Component Value Date   TRIG 119 05/17/2013   Lab Results  Component Value Date   CHOLHDL 3.2 05/17/2013     Assessment & Plan  Hematuria Just trace blood in urine today will repeat UA at next visit and consider urologic referral if worse.  Diabetes mellitus type 2, uncontrolled Worsening, reinforced need to minimize carbohydrates and increase exercise, has been using Lantus 14 if fsg >200 and 12 if <200. Will have her increase both doses by 2 units and let us know if numbers continue to run hi. Needs refills on test strips. Check sugars qid  due to uncontrolled sugars on insulin  GERD (gastroesophageal reflux disease) Avoid offending foods, continue probiotics and current meds  Anxiety and depression Doing well without meds at this time  Hyperlipidemia Tolerating Simvastatin, well controlled

## 2013-05-20 NOTE — Telephone Encounter (Signed)
Lab otrder week of 08-09-2013 Labs prior lipid, renal cbc, tsh, hepatic, hgba1c, vitamin d

## 2013-05-21 ENCOUNTER — Other Ambulatory Visit: Payer: Self-pay | Admitting: *Deleted

## 2013-05-21 ENCOUNTER — Other Ambulatory Visit: Payer: Self-pay

## 2013-05-21 DIAGNOSIS — E119 Type 2 diabetes mellitus without complications: Secondary | ICD-10-CM

## 2013-05-21 LAB — URINALYSIS
Bilirubin Urine: NEGATIVE
Glucose, UA: >1000 mg/dL — AB
Protein, ur: 30 mg/dL — AB
pH: 5 (ref 5.0–8.0)

## 2013-05-21 LAB — URINE CULTURE

## 2013-05-21 MED ORDER — GLUCOSE BLOOD VI STRP: ORAL_STRIP | Status: AC

## 2013-05-21 MED ORDER — INSULIN GLARGINE 100 UNIT/ML ~~LOC~~ SOLN: 12.0000 [IU] | Freq: Every day | SUBCUTANEOUS | Status: DC

## 2013-05-21 MED ORDER — ACCU-CHEK FASTCLIX LANCETS MISC: 1.0000 [IU] | Freq: Three times a day (TID) | Status: AC | PRN

## 2013-05-21 NOTE — Telephone Encounter (Signed)
Rx request to pharmacy/[facility]  

## 2013-05-23 NOTE — Assessment & Plan Note (Signed)
Just trace blood in urine today will repeat UA at next visit and consider urologic referral if worse.

## 2013-05-23 NOTE — Assessment & Plan Note (Signed)
Avoid offending foods, continue probiotics and current meds

## 2013-05-23 NOTE — Assessment & Plan Note (Signed)
Tolerating Simvastatin, well controlled

## 2013-05-23 NOTE — Assessment & Plan Note (Signed)
Doing well without meds at thist ime.

## 2013-05-23 NOTE — Assessment & Plan Note (Signed)
Worsening, reinforced need to minimize carbohydrates and increase exercise, has been using Lantus 14 if fsg >200 and 12 if <200. Will have her increase both doses by 2 units and let us know if numbers continue to run hi. Needs refills on test strips. Check sugars qid due to uncontrolled sugars on insulin

## 2013-05-26 ENCOUNTER — Telehealth: Payer: Self-pay

## 2013-05-26 DIAGNOSIS — E119 Type 2 diabetes mellitus without complications: Secondary | ICD-10-CM

## 2013-05-26 MED ORDER — INSULIN GLARGINE 100 UNIT/ML SOLOSTAR PEN: 12.0000 [IU] | PEN_INJECTOR | Freq: Every day | SUBCUTANEOUS | Status: DC

## 2013-05-26 NOTE — Telephone Encounter (Signed)
I will call. 12 Lantus pens were called into [facility]

## 2013-05-26 NOTE — Telephone Encounter (Signed)
[facility] called and stated that the patient needs a Lantus Solstar Inj. Pen sent to them, they received a refill for an insulin vial which is not what the patient is using.  Fax # [phone] CB# [phone]

## 2013-05-27 ENCOUNTER — Encounter: Payer: Self-pay | Admitting: Family Medicine

## 2013-05-28 ENCOUNTER — Other Ambulatory Visit: Payer: Self-pay

## 2013-05-28 DIAGNOSIS — Z1231 Encounter for screening mammogram for malignant neoplasm of breast: Secondary | ICD-10-CM

## 2013-06-09 ENCOUNTER — Ambulatory Visit
Admission: RE | Admit: 2013-06-09 | Discharge: 2013-06-09 | Disposition: A | Discharge status: Home / Self Care | Payer: BC Managed Care – PPO | Source: Ambulatory Visit

## 2013-06-09 ENCOUNTER — Encounter: Payer: Self-pay | Admitting: Physician Assistant

## 2013-06-09 ENCOUNTER — Ambulatory Visit (INDEPENDENT_AMBULATORY_CARE_PROVIDER_SITE_OTHER): Payer: BC Managed Care – PPO | Admitting: Physician Assistant

## 2013-06-09 VITALS — BP 116/78 | HR 77 | Temp 98.2°F | Resp 14 | Ht 63.0 in | Wt 138.5 lb

## 2013-06-09 DIAGNOSIS — D249 Benign neoplasm of unspecified breast: Secondary | ICD-10-CM

## 2013-06-09 DIAGNOSIS — N6459 Other signs and symptoms in breast: Secondary | ICD-10-CM

## 2013-06-09 DIAGNOSIS — Z1231 Encounter for screening mammogram for malignant neoplasm of breast: Secondary | ICD-10-CM

## 2013-06-09 DIAGNOSIS — N6452 Nipple discharge: Secondary | ICD-10-CM

## 2013-06-09 HISTORY — DX: Benign neoplasm of unspecified breast: D24.9

## 2013-06-09 LAB — CBC WITH DIFFERENTIAL/PLATELET
Eosinophils Absolute: 0.2 10*3/uL (ref 0.0–0.7)
Eosinophils Relative: 2 % (ref 0–5)
HCT: 38.2 % (ref 36.0–46.0)
Hemoglobin: 13 g/dL (ref 12.0–15.0)
Lymphocytes Relative: 36 % (ref 12–46)
Lymphs Abs: 2.4 10*3/uL (ref 0.7–4.0)
MCH: 29.1 pg (ref 26.0–34.0)
MCHC: 34 g/dL (ref 30.0–36.0)
MCV: 85.5 fL (ref 78.0–100.0)
Monocytes Absolute: 0.6 10*3/uL (ref 0.1–1.0)
Monocytes Relative: 9 % (ref 3–12)
Neutrophils Relative %: 53 % (ref 43–77)
Platelets: 303 10*3/uL (ref 150–400)
RBC: 4.47 MIL/uL (ref 3.87–5.11)
WBC: 6.6 10*3/uL (ref 4.0–10.5)

## 2013-06-09 NOTE — Progress Notes (Signed)
Patient ID: Robin Barry, female   DOB: 1966/05/16, 47 y.o.   MRN: [id number]   Patient presents to clinic today c/o spontaneous discharge from nipple of right breast first noticed 2 weeks ago.  Patient states discharge is clear-brown in color.  Discharge is spontaneous and intermittent.  Last episode noticed 4 days ago.  Patient denies pain, change in breast, palpable mass, rash, lesion, or nipple inversion.  No history of abnormal mammogram per patient.  [facility], but after discussion of symptoms, was told to report to her PCP for evaluation.  Patient denies discharge from left breast.   Past Medical History  Diagnosis Date  . Chicken pox as a child  . Measles as a child  . Allergic rhinitis     ragweed  . GERD (gastroesophageal reflux disease)   . Hypoglycemia 2006  . Anxiety   . Depression   . H/O otitis media     childhood  . Anxiety and depression   . Hemorrhoids 12/26/2011  . Enlarged thyroid 12/26/2011  . Preventative health care 12/26/2011  . Psoriasis 12/26/2011  . Diabetes mellitus type 2, uncontrolled   . Hyperlipidemia 03/23/2012  . Insomnia 03/23/2012  . Sun-damaged skin 06/23/2012  . Migraine 06/23/2012    Current Outpatient Prescriptions on File Prior to Visit  Medication Sig Dispense Refill  . ACCU-CHEK FASTCLIX LANCETS MISC 1 Units by Does not apply route 3 (three) times daily as needed. DX: 250.00  306 each  3  . Blood Glucose Calibration (ACCU-CHEK AVIVA) SOLN 1 drop by In Vitro route 4 (four) times daily as needed.  1 each  3  . Blood Glucose Monitoring Suppl (ACCU-CHEK AVIVA PLUS) W/DEVICE KIT 1 kit by Does not apply route 4 (four) times daily as needed.  1 kit  0  . cetirizine (ZYRTEC) 10 MG tablet Take 10 mg by mouth 2 (two) times daily.      Marland Kitchen co-enzyme Q-10 30 MG capsule Take 1 capsule (30 mg total) by mouth 2 (two) times daily.      Marland Kitchen EPINEPHrine (EPIPEN) 0.3 mg/0.3 mL SOAJ injection Inject into muscle in  case of severe allergic reaction then proceed directly to the ER.  2 Device  0  . glimepiride (AMARYL) 2 MG tablet Take 1 tablet (2 mg total) by mouth daily before breakfast.  90 tablet  2  . glucose blood (ACCU-CHEK AVIVA PLUS) test strip Use as instructed  300 each  3  . Insulin Pen Needle (BD PEN NEEDLE NANO U/F) 32G X 4 MM MISC As directed  100 each  3  . lisinopril (PRINIVIL,ZESTRIL) 5 MG tablet Take 1 tablet (5 mg total) by mouth daily.  90 tablet  2  . metFORMIN (GLUCOPHAGE) 500 MG tablet Take 1 tablet (500 mg total) by mouth 2 (two) times daily with a meal.  180 tablet  2  . montelukast (SINGULAIR) 10 MG tablet Take 1 tablet (10 mg total) by mouth at bedtime.  90 tablet  2  . NON FORMULARY Take 1 each by mouth daily. MEGA RED Multivitamin.      Marland Kitchen omeprazole (PRILOSEC) 10 MG capsule Take 10 mg by mouth daily.      . Probiotic Product (PROBIOTIC PEARLS ADVANTAGE) CAPS Take 1 capsule by mouth daily.      . rizatriptan (MAXALT) 10 MG tablet Take 1 tablet (10 mg total) by mouth as needed for migraine. Only fill if patient requests.  10 tablet  0  . simvastatin (ZOCOR) 20 MG tablet Take 1 tablet (20 mg total) by mouth every evening.  90 tablet  3   No current facility-administered medications on file prior to visit.    Allergies  Allergen Reactions  . Bactrim     rash  . Cephalexin     rash  . Codeine     hallucinations  . Terramycin     Family History  Problem Relation Age of Onset  . COPD Mother     smokes  . Leukemia Mother     chronic lymphatic  . Depression Mother   . Cancer Mother     skin, CLL  . Hypertension Father   . Cancer Father     prostate  . Diabetes Brother 20    Type 1  . Kidney disease Brother     dialysis  . Other Son     brain hemorrhage  . Cancer Maternal Grandmother     breast  . Alzheimer's disease Maternal Grandmother   . Cancer Maternal Grandfather     melanoma  . Cancer Paternal Grandmother     breast  . Emphysema Paternal Grandmother    . Alcohol abuse Paternal Grandfather   . Cancer Paternal Grandfather     prostate  . Other Maternal Aunt     thalessemia  . Other Maternal Aunt     thalesemia   . Cancer Maternal Aunt     lung  . Other Maternal Aunt     thalesemmia    History   Social History  . Marital Status: Married    Spouse Name: N/A    Number of Children: N/A  . Years of Education: N/A   Social History Main Topics  . Smoking status: Former Smoker -- 0.50 packs/day for 20 years    Types: Cigarettes    Quit date: 01/24/2010  . Smokeless tobacco: Never Used  . Alcohol Use: Yes     Comment: occasionally a little bit of wine  . Drug Use: No  . Sexual Activity: Yes    Partners: Male   Other Topics Concern  . None   Social History Narrative  . None   ROS See HPI.  All other ROS are negative.  Filed Vitals:   06/09/13 1312  BP: 116/78  Pulse: 77  Temp: 98.2 F (36.8 C)  Resp: 14    Physical Exam  Vitals reviewed. Constitutional: She is oriented to person, place, and time and well-developed, well-nourished, and in no distress.  Neck: Neck supple.  Cardiovascular: Normal rate, regular rhythm, normal heart sounds and intact distal pulses.   Pulmonary/Chest: Effort normal and breath sounds normal. No respiratory distress. She has no wheezes. She has no rales. She exhibits no tenderness. Right breast exhibits no inverted nipple, no mass, no nipple discharge, no skin change and no tenderness. Left breast exhibits no inverted nipple, no mass, no nipple discharge, no skin change and no tenderness. Breasts are symmetrical.  Lymphadenopathy:    She has no cervical adenopathy.    She has no axillary adenopathy.  Neurological: She is alert and oriented to person, place, and time.  Skin: Skin is warm and dry. No rash noted.  Psychiatric: Affect normal.   Recent Results (from the past 2160 hour(s))  POCT URINALYSIS DIPSTICK     Status: None   Collection Time    04/13/13 11:41 AM      Result Value  Range   Color, UA yellow  Clarity, UA cloudy     Glucose, UA >2000     Bilirubin, UA neg     Ketones, UA neg     Spec Grav, UA 1.010     Blood, UA moderate     pH, UA 5.0     Protein, UA 30     Urobilinogen, UA 0.2     Nitrite, UA neg     Leukocytes, UA Negative    URINE CULTURE     Status: None   Collection Time    04/13/13 11:49 AM      Result Value Range   Colony Count 30,000 COLONIES/ML     Organism ID, Bacteria GROUP B STREP (S.AGALACTIAE) ISOLATED     Comment: Testing against S. agalactiae not routinely     performed due to predictability of     AMP/PEN/VAN susceptibility.  LIPID PANEL     Status: None   Collection Time    05/17/13  3:45 PM      Result Value Range   Cholesterol 163  0 - 200 mg/dL   Comment: ATP III Classification:           < 200        mg/dL        Desirable          200 - 239     mg/dL        Borderline High          >= 240        mg/dL        High         Triglycerides 119  <150 mg/dL   HDL 51  >13 mg/dL   Total CHOL/HDL Ratio 3.2     VLDL 24  0 - 40 mg/dL   LDL Cholesterol 88  0 - 99 mg/dL   Comment:       Total Cholesterol/HDL Ratio:CHD Risk                            Coronary Heart Disease Risk Table                                            Men       Women              1/2 Average Risk              3.4        3.3                  Average Risk              5.0        4.4               2X Average Risk              9.6        7.1               3X Average Risk             23.4       11.0     Use the calculated Patient Ratio above and the CHD Risk table      to determine the patient's CHD Risk.     ATP  III Classification (LDL):           < 100        mg/dL         Optimal          100 - 129     mg/dL         Near or Above Optimal          130 - 159     mg/dL         Borderline High          160 - 189     mg/dL         High           > 190        mg/dL         Very High        RENAL FUNCTION PANEL     Status: Abnormal   Collection  Time    05/17/13  3:45 PM      Result Value Range   Sodium 136  135 - 145 mEq/L   Potassium 4.6  3.5 - 5.3 mEq/L   Chloride 102  96 - 112 mEq/L   CO2 27  19 - 32 mEq/L   Glucose, Bld 184 (*) 70 - 99 mg/dL   BUN 15  6 - 23 mg/dL   Creat 9.60  4.54 - 0.98 mg/dL   Albumin 4.4  3.5 - 5.2 g/dL   Calcium 9.8  8.4 - 11.9 mg/dL   Phosphorus 4.0  2.3 - 4.6 mg/dL  CBC     Status: None   Collection Time    05/17/13  3:45 PM      Result Value Range   WBC 5.6  4.0 - 10.5 K/uL   RBC 4.50  3.87 - 5.11 MIL/uL   Hemoglobin 13.2  12.0 - 15.0 g/dL   HCT 14.7  82.9 - 56.2 %   MCV 87.3  78.0 - 100.0 fL   MCH 29.3  26.0 - 34.0 pg   MCHC 33.6  30.0 - 36.0 g/dL   RDW 13.0  86.5 - 78.4 %   Platelets 308  150 - 400 K/uL  HEPATIC FUNCTION PANEL     Status: None   Collection Time    05/17/13  3:45 PM      Result Value Range   Total Bilirubin 0.3  0.3 - 1.2 mg/dL   Bilirubin, Direct 0.1  0.0 - 0.3 mg/dL   Indirect Bilirubin 0.2  0.0 - 0.9 mg/dL   Alkaline Phosphatase 55  39 - 117 U/L   AST 14  0 - 37 U/L   ALT 15  0 - 35 U/L   Total Protein 7.1  6.0 - 8.3 g/dL   Albumin 4.4  3.5 - 5.2 g/dL  TSH     Status: None   Collection Time    05/17/13  3:45 PM      Result Value Range   TSH 0.984  0.350 - 4.500 uIU/mL  HEMOGLOBIN A1C     Status: Abnormal   Collection Time    05/17/13  3:45 PM      Result Value Range   Hemoglobin A1C 10.1 (*) <5.7 %   Comment:  According to the ADA Clinical Practice Recommendations for 2011, when     HbA1c is used as a screening test:             >=6.5%   Diagnostic of Diabetes Mellitus                (if abnormal result is confirmed)           5.7-6.4%   Increased risk of developing Diabetes Mellitus           References:Diagnosis and Classification of Diabetes Mellitus,Diabetes     Care,2011,34(Suppl 1):S62-S69 and Standards of Medical Care in             Diabetes - 2011,Diabetes  Care,2011,34 (Suppl 1):S11-S61.         Mean Plasma Glucose 243 (*) <117 mg/dL  URINALYSIS     Status: Abnormal   Collection Time    05/20/13 11:24 AM      Result Value Range   Color, Urine YELLOW  YELLOW   APPearance CLEAR  CLEAR   Specific Gravity, Urine 1.021  1.005 - 1.030   pH 5.0  5.0 - 8.0   Glucose, UA > 1000 (*) NEG mg/dL   Bilirubin Urine NEG  NEG   Ketones, ur TRACE (*) NEG mg/dL   Hgb urine dipstick TRACE (*) NEG   Protein, ur 30 (*) NEG mg/dL   Urobilinogen, UA 0.2  0.0 - 1.0 mg/dL   Nitrite NEG  NEG   Leukocytes, UA NEG  NEG  URINE CULTURE     Status: None   Collection Time    05/20/13 11:24 AM      Result Value Range   Colony Count 40,000 COLONIES/ML     Organism ID, Bacteria Multiple bacterial morphotypes present, none     Organism ID, Bacteria predominant. Suggest appropriate recollection if      Organism ID, Bacteria clinically indicated.      Assessment/Plan: Nipple discharge in female Unilateral R Breast.  No mass, tenderness, lesion on exam.  No discharge expressed.  Will begin with CBC, Prolactin, Diagnostic bilateral mammogram, [place] of R breast.  Will treat/manage accordingly.

## 2013-06-09 NOTE — Assessment & Plan Note (Signed)
Unilateral R Breast.  No mass, tenderness, lesion on exam.  No discharge expressed.  Will begin with CBC, Prolactin, Diagnostic bilateral mammogram, [place] of R breast.  Will treat/manage accordingly.

## 2013-06-09 NOTE — Patient Instructions (Addendum)
Please obtain labs and imaging. I will call you with your results.

## 2013-06-10 LAB — PROLACTIN: Prolactin: 9.1 ng/mL

## 2013-06-16 ENCOUNTER — Telehealth: Payer: Self-pay | Admitting: Physician Assistant

## 2013-06-16 ENCOUNTER — Ambulatory Visit
Admission: RE | Admit: 2013-06-16 | Discharge: 2013-06-16 | Disposition: A | Discharge status: Home / Self Care | Payer: BC Managed Care – PPO | Source: Ambulatory Visit | Attending: Physician Assistant | Admitting: Physician Assistant

## 2013-06-16 ENCOUNTER — Other Ambulatory Visit: Payer: Self-pay | Admitting: Physician Assistant

## 2013-06-16 DIAGNOSIS — N6452 Nipple discharge: Secondary | ICD-10-CM

## 2013-06-16 NOTE — Telephone Encounter (Signed)
LMOM -- Wanted to verify patient had scheduled follow-up with [facility] for excision of Intraductal R Breast mass

## 2013-06-18 ENCOUNTER — Encounter (INDEPENDENT_AMBULATORY_CARE_PROVIDER_SITE_OTHER): Payer: Self-pay | Admitting: Surgery

## 2013-06-18 ENCOUNTER — Ambulatory Visit (INDEPENDENT_AMBULATORY_CARE_PROVIDER_SITE_OTHER): Payer: BC Managed Care – PPO | Admitting: Surgery

## 2013-06-18 VITALS — BP 120/78 | HR 92 | Temp 98.8°F | Resp 14 | Ht 62.5 in | Wt 140.0 lb

## 2013-06-18 DIAGNOSIS — N6459 Other signs and symptoms in breast: Secondary | ICD-10-CM

## 2013-06-18 DIAGNOSIS — N6452 Nipple discharge: Secondary | ICD-10-CM

## 2013-06-18 NOTE — Progress Notes (Signed)
Patient ID: Robin Barry, female   DOB: [birth year], 47 y.o.   MRN: [id number]  Chief Complaint  Patient presents with  . Breast Problem    eval rt br mass    HPI Robin Barry is a 47 y.o. female.  She was referred from her primary care physician because of a spontaneous right nipple discharge and an abnormal mammogram showing a mass in a centrally located duct 1.5 cm deep to the nipple. She's never had any other breast problems. Both of her grandmothers have had breast cancer, and apparently her maternal grandmother had at about age 79. She is not having any other symptoms. The nipple discharges been present about 6 weeks now. HPI  Past Medical History  Diagnosis Date  . Chicken pox as a child  . Measles as a child  . Allergic rhinitis     ragweed  . GERD (gastroesophageal reflux disease)   . Hypoglycemia 2006  . Anxiety   . Depression   . H/O otitis media     childhood  . Anxiety and depression   . Hemorrhoids 12/26/2011  . Enlarged thyroid 12/26/2011  . Preventative health care 12/26/2011  . Psoriasis 12/26/2011  . Diabetes mellitus type 2, uncontrolled   . Hyperlipidemia 03/23/2012  . Insomnia 03/23/2012  . Sun-damaged skin 06/23/2012  . Migraine 06/23/2012    Past Surgical History  Procedure Laterality Date  . Tubal ligation  1987    Family History  Problem Relation Age of Onset  . COPD Mother     smokes  . Leukemia Mother     chronic lymphatic  . Depression Mother   . Cancer Mother     skin, CLL  . Hypertension Father   . Cancer Father     prostate  . Diabetes Brother 20    Type 1  . Kidney disease Brother     dialysis  . Other Son     brain hemorrhage  . Cancer Maternal Grandmother     breast  . Alzheimer's disease Maternal Grandmother   . Cancer Maternal Grandfather     melanoma  . Cancer Paternal Grandmother     breast  . Emphysema Paternal Grandmother   . Alcohol abuse Paternal Grandfather   . Cancer Paternal Grandfather     prostate  . Other  Maternal Aunt     thalessemia  . Other Maternal Aunt     thalesemia   . Cancer Maternal Aunt     lung  . Other Maternal Aunt     thalesemmia    Social History History  Substance Use Topics  . Smoking status: Former Smoker -- 0.50 packs/day for 20 years    Types: Cigarettes    Quit date: 01/24/2010  . Smokeless tobacco: Never Used  . Alcohol Use: Yes     Comment: occasionally a little bit of wine    Allergies  Allergen Reactions  . Bactrim     rash  . Cephalexin     rash  . Codeine     hallucinations  . Terramycin     Current Outpatient Prescriptions  Medication Sig Dispense Refill  . ACCU-CHEK FASTCLIX LANCETS MISC 1 Units by Does not apply route 3 (three) times daily as needed. DX: 250.00  306 each  3  . Blood Glucose Calibration (ACCU-CHEK AVIVA) SOLN 1 drop by In Vitro route 4 (four) times daily as needed.  1 each  3  . Blood Glucose Monitoring Suppl (ACCU-CHEK AVIVA PLUS) W/DEVICE KIT 1 kit  by Does not apply route 4 (four) times daily as needed.  1 kit  0  . cetirizine (ZYRTEC) 10 MG tablet Take 10 mg by mouth 2 (two) times daily.      Marland Kitchen co-enzyme Q-10 30 MG capsule Take 1 capsule (30 mg total) by mouth 2 (two) times daily.      Marland Kitchen EPINEPHrine (EPIPEN) 0.3 mg/0.3 mL SOAJ injection Inject into muscle in case of severe allergic reaction then proceed directly to the ER.  2 Device  0  . glimepiride (AMARYL) 2 MG tablet Take 1 tablet (2 mg total) by mouth daily before breakfast.  90 tablet  2  . glucose blood (ACCU-CHEK AVIVA PLUS) test strip Use as instructed  300 each  3  . Insulin Glargine 100 UNIT/ML SOPN Inject 22 Units into the skin daily. DX 250.00 Sliding Scale +2 Please dispense enough for 90 day supply      . Insulin Pen Needle (BD PEN NEEDLE NANO U/F) 32G X 4 MM MISC As directed  100 each  3  . lisinopril (PRINIVIL,ZESTRIL) 5 MG tablet Take 1 tablet (5 mg total) by mouth daily.  90 tablet  2  . metFORMIN (GLUCOPHAGE) 500 MG tablet Take 1 tablet (500 mg total) by  mouth 2 (two) times daily with a meal.  180 tablet  2  . montelukast (SINGULAIR) 10 MG tablet Take 1 tablet (10 mg total) by mouth at bedtime.  90 tablet  2  . NON FORMULARY Take 1 each by mouth daily. MEGA RED Multivitamin.      Marland Kitchen omeprazole (PRILOSEC) 10 MG capsule Take 10 mg by mouth daily.      . Probiotic Product (PROBIOTIC PEARLS ADVANTAGE) CAPS Take 1 capsule by mouth daily.      . rizatriptan (MAXALT) 10 MG tablet Take 1 tablet (10 mg total) by mouth as needed for migraine. Only fill if patient requests.  10 tablet  0  . simvastatin (ZOCOR) 20 MG tablet Take 1 tablet (20 mg total) by mouth every evening.  90 tablet  3   No current facility-administered medications for this visit.    Review of Systems Review of Systems  Constitutional: Negative for fever, chills and unexpected weight change.  HENT: Negative for congestion, hearing loss, sore throat, trouble swallowing and voice change.   Eyes: Negative for visual disturbance.  Respiratory: Negative for cough and wheezing.   Cardiovascular: Negative for chest pain, palpitations and leg swelling.  Gastrointestinal: Negative for nausea, vomiting, abdominal pain, diarrhea, constipation, blood in stool, abdominal distention and anal bleeding.  Genitourinary: Negative for hematuria, vaginal bleeding and difficulty urinating.  Musculoskeletal: Negative for arthralgias.  Skin: Negative for rash and wound.  Neurological: Negative for seizures, syncope and headaches.  Hematological: Negative for adenopathy. Does not bruise/bleed easily.  Psychiatric/Behavioral: Negative for confusion.    Blood pressure 120/78, pulse 92, temperature 98.8 F (37.1 C), temperature source Temporal, resp. rate 14, height 5' 2.5" (1.588 m), weight 140 lb (63.504 kg), last menstrual period 03/21/2013.  Physical Exam Physical Exam  Vitals reviewed. Constitutional: She is oriented to person, place, and time. She appears well-developed and well-nourished. No  distress.  HENT:  Head: Normocephalic and atraumatic.  Mouth/Throat: Oropharynx is clear and moist.  Eyes: Conjunctivae and EOM are normal. Pupils are equal, round, and reactive to light. No scleral icterus.  Neck: Normal range of motion. Neck supple. No tracheal deviation present. No thyromegaly present.  Cardiovascular: Normal rate, regular rhythm, normal heart sounds and intact distal  pulses.  Exam reveals no gallop and no friction rub.   No murmur heard. Pulmonary/Chest: Effort normal and breath sounds normal. No respiratory distress. She has no wheezes. She has no rales. Right breast exhibits nipple discharge. Right breast exhibits no mass, no skin change and no tenderness. Left breast exhibits inverted nipple. Left breast exhibits no mass, no nipple discharge, no skin change and no tenderness. Breasts are symmetrical.  Abdominal: Soft. Bowel sounds are normal. She exhibits no distension and no mass. There is no tenderness. There is no rebound and no guarding.  Musculoskeletal: Normal range of motion. She exhibits no edema and no tenderness.  Neurological: She is alert and oriented to person, place, and time.  Skin: Skin is warm and dry. No rash noted. She is not diaphoretic. No erythema.  Psychiatric: She has a normal mood and affect. Her behavior is normal. Judgment and thought content normal.    Data Reviewed I have reviewed the notes in the electronic medical record, and the mammogram and ductogram reports. The films are not currently available for review.  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 We discussed this and explained the options. She would like to go ahead and schedule this. We will make those arrangements for her. I think all questions have been answered.        Gawain Crombie J 06/18/2013,  10:00 AM

## 2013-06-18 NOTE — Patient Instructions (Signed)
We will schedule surgery to remove the ductal tissue that is producing the nipple discharge from her right breast

## 2013-06-22 NOTE — Progress Notes (Signed)
Pt in [place]-will call-will plan to come in 11/17 for labs

## 2013-06-23 ENCOUNTER — Ambulatory Visit (INDEPENDENT_AMBULATORY_CARE_PROVIDER_SITE_OTHER): Payer: Self-pay | Admitting: General Surgery

## 2013-06-30 ENCOUNTER — Encounter (HOSPITAL_BASED_OUTPATIENT_CLINIC_OR_DEPARTMENT_OTHER): Payer: Self-pay | Admitting: *Deleted

## 2013-06-30 NOTE — Progress Notes (Signed)
Pt called-she lives in [place] but husband works in [employer] come 11/17 for ekg and bmet-

## 2013-07-10 ENCOUNTER — Encounter: Payer: Self-pay | Admitting: Family Medicine

## 2013-07-12 ENCOUNTER — Encounter (HOSPITAL_BASED_OUTPATIENT_CLINIC_OR_DEPARTMENT_OTHER)
Admission: RE | Admit: 2013-07-12 | Discharge: 2013-07-12 | Disposition: A | Discharge status: Home / Self Care | Payer: BC Managed Care – PPO | Source: Ambulatory Visit | Attending: Surgery | Admitting: Surgery

## 2013-07-12 LAB — BASIC METABOLIC PANEL
BUN: 10 mg/dL (ref 6–23)
CO2: 26 mEq/L (ref 19–32)
Calcium: 9.5 mg/dL (ref 8.4–10.5)
Chloride: 104 mEq/L (ref 96–112)
Creatinine, Ser: 0.65 mg/dL (ref 0.50–1.10)
GFR calc Af Amer: >90 mL/min (ref >90)

## 2013-07-13 ENCOUNTER — Ambulatory Visit (HOSPITAL_BASED_OUTPATIENT_CLINIC_OR_DEPARTMENT_OTHER)
Admission: RE | Admit: 2013-07-13 | Discharge: 2013-07-13 | Disposition: A | Discharge status: Home / Self Care | Payer: BC Managed Care – PPO | Source: Ambulatory Visit | Attending: Surgery | Admitting: Surgery

## 2013-07-13 ENCOUNTER — Ambulatory Visit (HOSPITAL_BASED_OUTPATIENT_CLINIC_OR_DEPARTMENT_OTHER): Payer: BC Managed Care – PPO | Admitting: Anesthesiology

## 2013-07-13 ENCOUNTER — Encounter (HOSPITAL_BASED_OUTPATIENT_CLINIC_OR_DEPARTMENT_OTHER): Payer: BC Managed Care – PPO | Admitting: Anesthesiology

## 2013-07-13 ENCOUNTER — Encounter (HOSPITAL_BASED_OUTPATIENT_CLINIC_OR_DEPARTMENT_OTHER)
Admission: RE | Disposition: A | Discharge status: Home / Self Care | Payer: Self-pay | Source: Ambulatory Visit | Attending: Surgery

## 2013-07-13 ENCOUNTER — Encounter (HOSPITAL_BASED_OUTPATIENT_CLINIC_OR_DEPARTMENT_OTHER): Payer: Self-pay | Admitting: *Deleted

## 2013-07-13 DIAGNOSIS — N6452 Nipple discharge: Secondary | ICD-10-CM

## 2013-07-13 DIAGNOSIS — Z87891 Personal history of nicotine dependence: Secondary | ICD-10-CM | POA: Insufficient documentation

## 2013-07-13 DIAGNOSIS — D249 Benign neoplasm of unspecified breast: Secondary | ICD-10-CM | POA: Insufficient documentation

## 2013-07-13 DIAGNOSIS — Z01810 Encounter for preprocedural cardiovascular examination: Secondary | ICD-10-CM | POA: Insufficient documentation

## 2013-07-13 DIAGNOSIS — E119 Type 2 diabetes mellitus without complications: Secondary | ICD-10-CM | POA: Insufficient documentation

## 2013-07-13 DIAGNOSIS — Z01812 Encounter for preprocedural laboratory examination: Secondary | ICD-10-CM | POA: Insufficient documentation

## 2013-07-13 HISTORY — DX: Family history of other specified conditions: Z84.89

## 2013-07-13 HISTORY — DX: Presence of spectacles and contact lenses: Z97.3

## 2013-07-13 HISTORY — PX: BREAST DUCTAL SYSTEM EXCISION: SHX5242

## 2013-07-13 HISTORY — DX: Presence of dental prosthetic device (complete) (partial): Z97.2

## 2013-07-13 HISTORY — DX: Complete loss of teeth, unspecified cause, unspecified class: K08.109

## 2013-07-13 LAB — GLUCOSE, CAPILLARY: Glucose-Capillary: 115 mg/dL — ABNORMAL HIGH (ref 70–99)

## 2013-07-13 SURGERY — EXCISION DUCTAL SYSTEM BREAST
Anesthesia: General | Site: Breast | Laterality: Right | Wound class: Clean

## 2013-07-13 MED ORDER — FENTANYL CITRATE 0.05 MG/ML IJ SOLN
INTRAMUSCULAR | Status: AC
  Filled 2013-07-13: qty 2

## 2013-07-13 MED ORDER — PROPOFOL 10 MG/ML IV BOLUS
INTRAVENOUS | Status: DC | PRN
  Administered 2013-07-13: 200 mg via INTRAVENOUS

## 2013-07-13 MED ORDER — PROMETHAZINE HCL 25 MG RE SUPP
RECTAL | Status: AC
  Filled 2013-07-13: qty 1

## 2013-07-13 MED ORDER — FENTANYL CITRATE 0.05 MG/ML IJ SOLN: 50.0000 ug | INTRAMUSCULAR | Status: DC | PRN

## 2013-07-13 MED ORDER — CHLORHEXIDINE GLUCONATE 4 % EX LIQD: 1.0000 "application " | Freq: Once | CUTANEOUS | Status: DC

## 2013-07-13 MED ORDER — HYDROMORPHONE HCL 2 MG PO TABS: 2.0000 mg | ORAL_TABLET | ORAL | Status: DC | PRN

## 2013-07-13 MED ORDER — METOCLOPRAMIDE HCL 5 MG/ML IJ SOLN
10.0000 mg | Freq: Once | INTRAMUSCULAR | Status: AC | PRN
  Administered 2013-07-13: 10 mg via INTRAVENOUS

## 2013-07-13 MED ORDER — SUCCINYLCHOLINE CHLORIDE 20 MG/ML IJ SOLN
INTRAMUSCULAR | Status: AC
  Filled 2013-07-13: qty 1

## 2013-07-13 MED ORDER — PROMETHAZINE HCL 25 MG RE SUPP
25.0000 mg | Freq: Once | RECTAL | Status: AC
  Administered 2013-07-13: 25 mg via RECTAL

## 2013-07-13 MED ORDER — ONDANSETRON HCL 4 MG/2ML IJ SOLN: 4.0000 mg | Freq: Once | INTRAMUSCULAR | Status: DC | PRN

## 2013-07-13 MED ORDER — FENTANYL CITRATE 0.05 MG/ML IJ SOLN
INTRAMUSCULAR | Status: AC
  Filled 2013-07-13: qty 6

## 2013-07-13 MED ORDER — CIPROFLOXACIN IN D5W 400 MG/200ML IV SOLN
400.0000 mg | INTRAVENOUS | Status: AC
  Administered 2013-07-13 (×2): 400 mg via INTRAVENOUS

## 2013-07-13 MED ORDER — METHYLENE BLUE 1 % INJ SOLN
INTRAMUSCULAR | Status: AC
  Filled 2013-07-13: qty 10

## 2013-07-13 MED ORDER — LIDOCAINE HCL (CARDIAC) 20 MG/ML IV SOLN
INTRAVENOUS | Status: DC | PRN
  Administered 2013-07-13: 60 mg via INTRAVENOUS

## 2013-07-13 MED ORDER — DEXAMETHASONE SODIUM PHOSPHATE 4 MG/ML IJ SOLN
INTRAMUSCULAR | Status: DC | PRN
  Administered 2013-07-13: 10 mg via INTRAVENOUS

## 2013-07-13 MED ORDER — KETOROLAC TROMETHAMINE 30 MG/ML IJ SOLN: 15.0000 mg | Freq: Once | INTRAMUSCULAR | Status: DC | PRN

## 2013-07-13 MED ORDER — CIPROFLOXACIN IN D5W 400 MG/200ML IV SOLN
INTRAVENOUS | Status: AC
  Filled 2013-07-13: qty 200

## 2013-07-13 MED ORDER — METOCLOPRAMIDE HCL 5 MG/ML IJ SOLN
INTRAMUSCULAR | Status: AC
  Filled 2013-07-13: qty 2

## 2013-07-13 MED ORDER — MIDAZOLAM HCL 2 MG/2ML IJ SOLN: 1.0000 mg | INTRAMUSCULAR | Status: DC | PRN

## 2013-07-13 MED ORDER — ONDANSETRON HCL 4 MG/2ML IJ SOLN
INTRAMUSCULAR | Status: DC | PRN
  Administered 2013-07-13: 4 mg via INTRAVENOUS

## 2013-07-13 MED ORDER — MIDAZOLAM HCL 2 MG/2ML IJ SOLN
INTRAMUSCULAR | Status: AC
  Filled 2013-07-13: qty 2

## 2013-07-13 MED ORDER — BUPIVACAINE HCL (PF) 0.25 % IJ SOLN
INTRAMUSCULAR | Status: DC | PRN
  Administered 2013-07-13: 20 mL

## 2013-07-13 MED ORDER — MIDAZOLAM HCL 5 MG/5ML IJ SOLN
INTRAMUSCULAR | Status: DC | PRN
  Administered 2013-07-13: 2 mg via INTRAVENOUS

## 2013-07-13 MED ORDER — BUPIVACAINE HCL (PF) 0.25 % IJ SOLN
INTRAMUSCULAR | Status: AC
  Filled 2013-07-13: qty 30

## 2013-07-13 MED ORDER — METHYLENE BLUE 1 % INJ SOLN
INTRAMUSCULAR | Status: DC | PRN
  Administered 2013-07-13: .2 mL via SUBMUCOSAL

## 2013-07-13 MED ORDER — FENTANYL CITRATE 0.05 MG/ML IJ SOLN
INTRAMUSCULAR | Status: DC | PRN
  Administered 2013-07-13: 100 ug via INTRAVENOUS
  Administered 2013-07-13: 25 ug via INTRAVENOUS

## 2013-07-13 MED ORDER — FENTANYL CITRATE 0.05 MG/ML IJ SOLN
25.0000 ug | INTRAMUSCULAR | Status: DC | PRN
  Administered 2013-07-13: 50 ug via INTRAVENOUS
  Administered 2013-07-13: 25 ug via INTRAVENOUS

## 2013-07-13 MED ORDER — LACTATED RINGERS IV SOLN
INTRAVENOUS | Status: DC
  Administered 2013-07-13: 20 mL/h via INTRAVENOUS
  Administered 2013-07-13 (×2): via INTRAVENOUS

## 2013-07-13 SURGICAL SUPPLY — 48 items
APPLICATOR COTTON TIP 6IN STRL (MISCELLANEOUS) IMPLANT
BANDAGE ELASTIC 6 VELCRO ST LF (GAUZE/BANDAGES/DRESSINGS) IMPLANT
BLADE HEX COATED 2.75 (ELECTRODE) ×2 IMPLANT
BLADE SURG 15 STRL LF DISP TIS (BLADE) ×1 IMPLANT
BLADE SURG 15 STRL SS (BLADE) ×1
BNDG COHESIVE 6X5 TAN STRL LF (GAUZE/BANDAGES/DRESSINGS) IMPLANT
CANISTER SUCT 1200ML W/VALVE (MISCELLANEOUS) ×2 IMPLANT
CHLORAPREP W/TINT 26ML (MISCELLANEOUS) ×2 IMPLANT
CLIP TI MEDIUM 6 (CLIP) IMPLANT
CLIP TI WIDE RED SMALL 6 (CLIP) IMPLANT
COVER MAYO STAND STRL (DRAPES) ×2 IMPLANT
COVER TABLE BACK 60X90 (DRAPES) ×2 IMPLANT
DECANTER SPIKE VIAL GLASS SM (MISCELLANEOUS) ×2 IMPLANT
DERMABOND ADVANCED (GAUZE/BANDAGES/DRESSINGS) ×2
DERMABOND ADVANCED .7 DNX12 (GAUZE/BANDAGES/DRESSINGS) ×2 IMPLANT
DRAPE LAPAROTOMY TRNSV 102X78 (DRAPE) ×2 IMPLANT
DRAPE SURG 17X23 STRL (DRAPES) IMPLANT
DRAPE UTILITY XL STRL (DRAPES) ×2 IMPLANT
ELECT REM PT RETURN 9FT ADLT (ELECTROSURGICAL) ×2
ELECTRODE REM PT RTRN 9FT ADLT (ELECTROSURGICAL) ×1 IMPLANT
GLOVE BIOGEL PI IND STRL 7.0 (GLOVE) ×1 IMPLANT
GLOVE BIOGEL PI INDICATOR 7.0 (GLOVE) ×1
GLOVE ECLIPSE 6.5 STRL STRAW (GLOVE) ×2 IMPLANT
GLOVE EUDERMIC 7 POWDERFREE (GLOVE) ×2 IMPLANT
GLOVE EXAM NITRILE MD LF STRL (GLOVE) ×2 IMPLANT
GOWN PREVENTION PLUS XLARGE (GOWN DISPOSABLE) ×4 IMPLANT
NDL SAFETY ECLIPSE 18X1.5 (NEEDLE) ×2 IMPLANT
NEEDLE HYPO 18GX1.5 SHARP (NEEDLE) ×2
NEEDLE HYPO 25X1 1.5 SAFETY (NEEDLE) ×4 IMPLANT
NS IRRIG 1000ML POUR BTL (IV SOLUTION) ×2 IMPLANT
PACK BASIN DAY SURGERY FS (CUSTOM PROCEDURE TRAY) ×2 IMPLANT
PENCIL BUTTON HOLSTER BLD 10FT (ELECTRODE) ×2 IMPLANT
SLEEVE SCD COMPRESS KNEE MED (MISCELLANEOUS) ×2 IMPLANT
SPONGE GAUZE 4X4 12PLY (GAUZE/BANDAGES/DRESSINGS) IMPLANT
SPONGE INTESTINAL PEANUT (DISPOSABLE) IMPLANT
SPONGE LAP 4X18 X RAY DECT (DISPOSABLE) ×2 IMPLANT
STAPLER VISISTAT 35W (STAPLE) IMPLANT
SUT MNCRL AB 4-0 PS2 18 (SUTURE) ×2 IMPLANT
SUT SILK 0 TIES 10X30 (SUTURE) IMPLANT
SUT VIC AB 3-0 CT1 27 (SUTURE)
SUT VIC AB 3-0 CT1 27XBRD (SUTURE) IMPLANT
SUT VICRYL 3-0 CR8 SH (SUTURE) ×2 IMPLANT
SYR BULB 3OZ (MISCELLANEOUS) IMPLANT
SYR CONTROL 10ML LL (SYRINGE) ×2 IMPLANT
SYR TB 1ML 25GX5/8 (SYRINGE) ×2 IMPLANT
TOWEL OR NON WOVEN STRL DISP B (DISPOSABLE) ×2 IMPLANT
TUBE CONNECTING 20X1/4 (TUBING) ×2 IMPLANT
YANKAUER SUCT BULB TIP NO VENT (SUCTIONS) ×2 IMPLANT

## 2013-07-13 NOTE — H&P (View-Only) (Signed)
Patient ID: Robin Barry, female   DOB: 08/30/1965, 46 y.o.   MRN: [id number]  Chief Complaint  Patient presents with  . Breast Problem    eval rt br mass    HPI Robin Barry is a 46 y.o. female.  She was referred from her primary care physician because of a spontaneous right nipple discharge and an abnormal mammogram showing a mass in a centrally located duct 1.5 cm deep to the nipple. She's never had any other breast problems. Both of her grandmothers have had breast cancer, and apparently her maternal grandmother had at about age 30. She is not having any other symptoms. The nipple discharges been present about 6 weeks now. HPI  Past Medical History  Diagnosis Date  . Chicken pox as a child  . Measles as a child  . Allergic rhinitis     ragweed  . GERD (gastroesophageal reflux disease)   . Hypoglycemia 2006  . Anxiety   . Depression   . H/O otitis media     childhood  . Anxiety and depression   . Hemorrhoids 12/26/2011  . Enlarged thyroid 12/26/2011  . Preventative health care 12/26/2011  . Psoriasis 12/26/2011  . Diabetes mellitus type 2, uncontrolled   . Hyperlipidemia 03/23/2012  . Insomnia 03/23/2012  . Sun-damaged skin 06/23/2012  . Migraine 06/23/2012    Past Surgical History  Procedure Laterality Date  . Tubal ligation  1987    Family History  Problem Relation Age of Onset  . COPD Mother     smokes  . Leukemia Mother     chronic lymphatic  . Depression Mother   . Cancer Mother     skin, CLL  . Hypertension Father   . Cancer Father     prostate  . Diabetes Brother 20    Type 1  . Kidney disease Brother     dialysis  . Other Son     brain hemorrhage  . Cancer Maternal Grandmother     breast  . Alzheimer's disease Maternal Grandmother   . Cancer Maternal Grandfather     melanoma  . Cancer Paternal Grandmother     breast  . Emphysema Paternal Grandmother   . Alcohol abuse Paternal Grandfather   . Cancer Paternal Grandfather     prostate  . Other  Maternal Aunt     thalessemia  . Other Maternal Aunt     thalesemia   . Cancer Maternal Aunt     lung  . Other Maternal Aunt     thalesemmia    Social History History  Substance Use Topics  . Smoking status: Former Smoker -- 0.50 packs/day for 20 years    Types: Cigarettes    Quit date: 01/24/2010  . Smokeless tobacco: Never Used  . Alcohol Use: Yes     Comment: occasionally a little bit of wine    Allergies  Allergen Reactions  . Bactrim     rash  . Cephalexin     rash  . Codeine     hallucinations  . Terramycin     Current Outpatient Prescriptions  Medication Sig Dispense Refill  . ACCU-CHEK FASTCLIX LANCETS MISC 1 Units by Does not apply route 3 (three) times daily as needed. DX: 250.00  306 each  3  . Blood Glucose Calibration (ACCU-CHEK AVIVA) SOLN 1 drop by In Vitro route 4 (four) times daily as needed.  1 each  3  . Blood Glucose Monitoring Suppl (ACCU-CHEK AVIVA PLUS) W/DEVICE KIT 1 kit   by Does not apply route 4 (four) times daily as needed.  1 kit  0  . cetirizine (ZYRTEC) 10 MG tablet Take 10 mg by mouth 2 (two) times daily.      . co-enzyme Q-10 30 MG capsule Take 1 capsule (30 mg total) by mouth 2 (two) times daily.      . EPINEPHrine (EPIPEN) 0.3 mg/0.3 mL SOAJ injection Inject into muscle in case of severe allergic reaction then proceed directly to the ER.  2 Device  0  . glimepiride (AMARYL) 2 MG tablet Take 1 tablet (2 mg total) by mouth daily before breakfast.  90 tablet  2  . glucose blood (ACCU-CHEK AVIVA PLUS) test strip Use as instructed  300 each  3  . Insulin Glargine 100 UNIT/ML SOPN Inject 22 Units into the skin daily. DX 250.00 Sliding Scale +2 Please dispense enough for 90 day supply      . Insulin Pen Needle (BD PEN NEEDLE NANO U/F) 32G X 4 MM MISC As directed  100 each  3  . lisinopril (PRINIVIL,ZESTRIL) 5 MG tablet Take 1 tablet (5 mg total) by mouth daily.  90 tablet  2  . metFORMIN (GLUCOPHAGE) 500 MG tablet Take 1 tablet (500 mg total) by  mouth 2 (two) times daily with a meal.  180 tablet  2  . montelukast (SINGULAIR) 10 MG tablet Take 1 tablet (10 mg total) by mouth at bedtime.  90 tablet  2  . NON FORMULARY Take 1 each by mouth daily. MEGA RED Multivitamin.      . omeprazole (PRILOSEC) 10 MG capsule Take 10 mg by mouth daily.      . Probiotic Product (PROBIOTIC PEARLS ADVANTAGE) CAPS Take 1 capsule by mouth daily.      . rizatriptan (MAXALT) 10 MG tablet Take 1 tablet (10 mg total) by mouth as needed for migraine. Only fill if patient requests.  10 tablet  0  . simvastatin (ZOCOR) 20 MG tablet Take 1 tablet (20 mg total) by mouth every evening.  90 tablet  3   No current facility-administered medications for this visit.    Review of Systems Review of Systems  Constitutional: Negative for fever, chills and unexpected weight change.  HENT: Negative for congestion, hearing loss, sore throat, trouble swallowing and voice change.   Eyes: Negative for visual disturbance.  Respiratory: Negative for cough and wheezing.   Cardiovascular: Negative for chest pain, palpitations and leg swelling.  Gastrointestinal: Negative for nausea, vomiting, abdominal pain, diarrhea, constipation, blood in stool, abdominal distention and anal bleeding.  Genitourinary: Negative for hematuria, vaginal bleeding and difficulty urinating.  Musculoskeletal: Negative for arthralgias.  Skin: Negative for rash and wound.  Neurological: Negative for seizures, syncope and headaches.  Hematological: Negative for adenopathy. Does not bruise/bleed easily.  Psychiatric/Behavioral: Negative for confusion.    Blood pressure 120/78, pulse 92, temperature 98.8 F (37.1 C), temperature source Temporal, resp. rate 14, height 5' 2.5" (1.588 m), weight 140 lb (63.504 kg), last menstrual period 03/21/2013.  Physical Exam Physical Exam  Vitals reviewed. Constitutional: She is oriented to person, place, and time. She appears well-developed and well-nourished. No  distress.  HENT:  Head: Normocephalic and atraumatic.  Mouth/Throat: Oropharynx is clear and moist.  Eyes: Conjunctivae and EOM are normal. Pupils are equal, round, and reactive to light. No scleral icterus.  Neck: Normal range of motion. Neck supple. No tracheal deviation present. No thyromegaly present.  Cardiovascular: Normal rate, regular rhythm, normal heart sounds and intact distal   pulses.  Exam reveals no gallop and no friction rub.   No murmur heard. Pulmonary/Chest: Effort normal and breath sounds normal. No respiratory distress. She has no wheezes. She has no rales. Right breast exhibits nipple discharge. Right breast exhibits no mass, no skin change and no tenderness. Left breast exhibits inverted nipple. Left breast exhibits no mass, no nipple discharge, no skin change and no tenderness. Breasts are symmetrical.  Abdominal: Soft. Bowel sounds are normal. She exhibits no distension and no mass. There is no tenderness. There is no rebound and no guarding.  Musculoskeletal: Normal range of motion. She exhibits no edema and no tenderness.  Neurological: She is alert and oriented to person, place, and time.  Skin: Skin is warm and dry. No rash noted. She is not diaphoretic. No erythema.  Psychiatric: She has a normal mood and affect. Her behavior is normal. Judgment and thought content normal.    Data Reviewed I have reviewed the notes in the electronic medical record, and the mammogram and ductogram reports. The films are not currently available for review.  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 We discussed this and explained the options. She would like to go ahead and schedule this. We will make those arrangements for her. I think all questions have been answered.        Constantino Starace J 06/18/2013,  10:00 AM    

## 2013-07-13 NOTE — Interval H&P Note (Signed)
History and Physical Interval Note:  07/13/2013 8:59 AM  Robin Barry  has presented today for surgery, with the diagnosis of nipple discharge   The various methods of treatment have been discussed with the patient and family. After consideration of risks, benefits and other options for treatment, the patient has consented to  Procedure(s): EXCISION DUCTAL SYSTEM BREAST (Right) as a surgical intervention .  The patient's history has been reviewed, patient examined, no change in status, stable for surgery.  I have reviewed the patient's chart and labs.  Questions were answered to the patient's satisfaction.   The right breast is marked as teh operative side  Hammond Obeirne J

## 2013-07-13 NOTE — Anesthesia Preprocedure Evaluation (Signed)
Anesthesia Evaluation  Patient identified by MRN, date of birth, ID band Patient awake    Reviewed: Allergy & Precautions, H&P , NPO status , Patient's Chart, lab work & pertinent test results  Airway Mallampati: II TM Distance: >3 FB Neck ROM: Full    Dental  (+) Edentulous Upper and Edentulous Lower   Pulmonary former smoker,  breath sounds clear to auscultation        Cardiovascular Rhythm:Regular Rate:Normal     Neuro/Psych    GI/Hepatic   Endo/Other    Renal/GU      Musculoskeletal   Abdominal   Peds  Hematology   Anesthesia Other Findings   Reproductive/Obstetrics                           Anesthesia Physical Anesthesia Plan  ASA: II  Anesthesia Plan: General   Post-op Pain Management:    Induction: Intravenous  Airway Management Planned: LMA  Additional Equipment:   Intra-op Plan:   Post-operative Plan: Extubation in OR  Informed Consent: I have reviewed the patients History and Physical, chart, labs and discussed the procedure including the risks, benefits and alternatives for the proposed anesthesia with the patient or authorized representative who has indicated his/her understanding and acceptance.     Plan Discussed with: CRNA and Anesthesiologist  Anesthesia Plan Comments: (R. Breast Ca Type 2 DM glucose  116  Plan GA with LMA  Kipp Brood, MD)        Anesthesia Quick Evaluation

## 2013-07-13 NOTE — Transfer of Care (Signed)
Immediate Anesthesia Transfer of Care Note  Patient: Robin Barry  Procedure(s) Performed: Procedure(s): EXCISION DUCTAL SYSTEM BREAST (Right)  Patient Location: PACU  Anesthesia Type:General  Level of Consciousness: awake, alert , oriented and patient cooperative  Airway & Oxygen Therapy: Patient Spontanous Breathing and Patient connected to face mask oxygen  Post-op Assessment: Report given to PACU RN and Post -op Vital signs reviewed and stable  Post vital signs: Reviewed and stable  Complications: No apparent anesthesia complications

## 2013-07-13 NOTE — Anesthesia Procedure Notes (Signed)
Procedure Name: LMA Insertion Date/Time: 07/13/2013 9:19 AM Performed by: Currie Paris Pre-anesthesia Checklist: Patient identified, Emergency Drugs available, Suction available and Patient being monitored Patient Re-evaluated:Patient Re-evaluated prior to inductionOxygen Delivery Method: Circle System Utilized Preoxygenation: Pre-oxygenation with 100% oxygen Intubation Type: IV induction Ventilation: Mask ventilation without difficulty LMA: LMA inserted LMA Size: 4.0 Number of attempts: 1 Airway Equipment and Method: bite block Placement Confirmation: positive ETCO2 Tube secured with: Tape Dental Injury: Teeth and Oropharynx as per pre-operative assessment

## 2013-07-13 NOTE — Anesthesia Postprocedure Evaluation (Signed)
  Anesthesia Post-op Note  Patient: Robin Barry  Procedure(s) Performed: Procedure(s): EXCISION DUCTAL SYSTEM BREAST (Right)  Patient Location: PACU  Anesthesia Type:General  Level of Consciousness: awake, alert  and oriented  Airway and Oxygen Therapy: Patient Spontanous Breathing and Patient connected to nasal cannula oxygen  Post-op Pain: mild  Post-op Assessment: Post-op Vital signs reviewed, Patient's Cardiovascular Status Stable, Respiratory Function Stable, Patent Airway and Pain level controlled  Post-op Vital Signs: stable  Complications: No apparent anesthesia complications

## 2013-07-13 NOTE — Op Note (Signed)
LOURA PITT [birth year] [id number] 07/13/2013   Preoperative diagnosis: nipple discharge, right breast, with filling defect on ductogram.  Postoperative diagnosis: same  Procedure: right breast central ductal excision  Surgeon: Currie Paris, MD, FACS  Anesthesia: GA combined with regional for post-op pain   Clinical History and Indications: this patient has a spontaneous clear nipple discharge from the right breast. A ductogram shows what appears to be a filling defect centrally about 1.5 cm deep to the nipple. Excisional biopsy was recommended. This is not amenable to a needle core biopsy.    Description of Procedure: patient in the preoperative area, confirmed the plans, and marked the right breast as the operative side. She was taken to the operating room and satisfactory general anesthesia obtained. The breast was prepped and draped in a time out was done.  Duct was identified as clear fluid was copiously draining out of it. I dilated it with tear duct probe starting with a 2-0 probe and gradually going up to a #5 probe.I then injected about the 10th of a cc of methylene blue into the duct. The probe was replaced. With the probe in the duct I was able to make a curvilinear incision at the inferior medial aspect of the ureteral margin. A thin skin flap was raised and slightly identified the duct and divided at its junction with the dermis. I then excised the tissue around the duct trying to include all of the blue stained tissue.  Once this was done I made sure everything was dry. I infiltrated 10 cc of 0.25% plain Marcaine. I closed in layers with 3-0 Vicryl, 4 amount of subcuticular and Dermabond.  The patient part of the procedure well. There were no operative complications. Counts were correct.  Currie Paris, MD, FACS 07/13/2013 10:13 AM

## 2013-07-14 ENCOUNTER — Telehealth (INDEPENDENT_AMBULATORY_CARE_PROVIDER_SITE_OTHER): Payer: Self-pay | Admitting: *Deleted

## 2013-07-14 ENCOUNTER — Encounter (HOSPITAL_BASED_OUTPATIENT_CLINIC_OR_DEPARTMENT_OTHER): Payer: Self-pay | Admitting: Surgery

## 2013-07-14 NOTE — Telephone Encounter (Signed)
I called pt to check on her postoperatively.  She states she is doing well, she did mention her throat was hurting which I ensured her that was normal after surgery, and it will go aware in 1-2 days.  She also states that she is leaving for [place] in a couple days and will not be back until the first of January.  She states that Dr. Jamey Ripa is aware of this.  I instructed her to call our office with any questions or concerns.

## 2013-07-15 ENCOUNTER — Telehealth (INDEPENDENT_AMBULATORY_CARE_PROVIDER_SITE_OTHER): Payer: Self-pay | Admitting: General Surgery

## 2013-07-15 NOTE — Telephone Encounter (Signed)
Patient made aware of path results. Will follow up at appt and call with any questions prior.  

## 2013-07-15 NOTE — Telephone Encounter (Signed)
Message copied by Liliana Cline on Thu Jul 15, 2013  9:31 AM ------      Message from: Currie Paris      Created: Wed Jul 14, 2013  4:47 PM       Tell her path is benign and as expected ------

## 2013-08-06 ENCOUNTER — Encounter (INDEPENDENT_AMBULATORY_CARE_PROVIDER_SITE_OTHER): Payer: BC Managed Care – PPO | Admitting: Surgery

## 2013-08-24 ENCOUNTER — Encounter (INDEPENDENT_AMBULATORY_CARE_PROVIDER_SITE_OTHER): Payer: Self-pay | Admitting: Surgery

## 2013-08-24 ENCOUNTER — Ambulatory Visit: Payer: BC Managed Care – PPO | Admitting: Family Medicine

## 2013-08-24 ENCOUNTER — Ambulatory Visit (INDEPENDENT_AMBULATORY_CARE_PROVIDER_SITE_OTHER): Payer: BC Managed Care – PPO | Admitting: Surgery

## 2013-08-24 VITALS — BP 142/90 | HR 92 | Temp 98.1°F | Resp 14 | Ht 62.0 in | Wt 142.4 lb

## 2013-08-24 DIAGNOSIS — Z09 Encounter for follow-up examination after completed treatment for conditions other than malignant neoplasm: Secondary | ICD-10-CM

## 2013-08-24 LAB — HEMOGLOBIN A1C
Hgb A1c MFr Bld: 8.3 % — ABNORMAL HIGH (ref <5.7)
Mean Plasma Glucose: 192 mg/dL — ABNORMAL HIGH (ref <117)

## 2013-08-24 LAB — HEPATIC FUNCTION PANEL
ALT: 14 U/L (ref 0–35)
AST: 17 U/L (ref 0–37)
Bilirubin, Direct: 0.1 mg/dL (ref 0.0–0.3)
Total Bilirubin: 0.4 mg/dL (ref 0.3–1.2)

## 2013-08-24 LAB — CBC
MCV: 86.6 fL (ref 78.0–100.0)
Platelets: 268 10*3/uL (ref 150–400)
RDW: 14.1 % (ref 11.5–15.5)
WBC: 5.7 10*3/uL (ref 4.0–10.5)

## 2013-08-24 LAB — RENAL FUNCTION PANEL
Albumin: 4 g/dL (ref 3.5–5.2)
Creat: 0.68 mg/dL (ref 0.50–1.10)
Phosphorus: 2.8 mg/dL (ref 2.3–4.6)
Potassium: 4 mEq/L (ref 3.5–5.3)
Sodium: 139 mEq/L (ref 135–145)

## 2013-08-24 LAB — LIPID PANEL
HDL: 51 mg/dL (ref >39)
LDL Cholesterol: 83 mg/dL (ref 0–99)
Triglycerides: 98 mg/dL (ref <150)
VLDL: 20 mg/dL (ref 0–40)

## 2013-08-24 NOTE — Patient Instructions (Signed)
Contniue annual mammograms and consider a 3D mammogram next time since you seem to have dense breasts.

## 2013-08-24 NOTE — Progress Notes (Signed)
NAME: Robin Barry                                            DOB: 09/19/1965 DATE: 08/24/2013                                                   MRN: [id number]  CC:  Chief Complaint  Patient presents with  . Routine Post Op    reck breast exc    HPI: This patient comes in for post op follow-up .Sheunderwent right breast ductal excision on 07/13/13. She feels that she is doing well.  PE:  VITAL SIGNS: BP 142/90  Pulse 92  Temp(Src) 98.1 F (36.7 C) (Temporal)  Resp 14  Ht 5\' 2"  (1.575 m)  Wt 142 lb 6.4 oz (64.592 kg)  BMI 26.04 kg/m2  General: The patient appears to be healthy, NAD Incision: healing nicely, mild nipple retraction  DATA REVIEWED: Diagnosis Breast, excision, Right, nipple, central ductal - INTRADUCTAL PAPILLOMA, SEE COMMENT. - NEGATIVE FOR ATYPIA OR MALIGNANCY. - MICROCALCIFICATIONS IDENTIFIED.  IMPRESSION: The patient is doing well S/P right ductal excision.    PLAN: RTC PRN I gave the patient a copy of the pathology report and reviewed it with her Suggested she consider 3D mammogram as she has dense breasts when she has next mammogram

## 2013-08-26 ENCOUNTER — Encounter: Payer: Self-pay | Admitting: Family Medicine

## 2013-08-30 MED ORDER — VITAMIN D (ERGOCALCIFEROL) 1.25 MG (50000 UNIT) PO CAPS: 50000.0000 [IU] | ORAL_CAPSULE | ORAL | Status: DC

## 2013-09-02 ENCOUNTER — Encounter: Payer: Self-pay | Admitting: Family Medicine

## 2013-09-02 ENCOUNTER — Ambulatory Visit (INDEPENDENT_AMBULATORY_CARE_PROVIDER_SITE_OTHER): Payer: BC Managed Care – PPO | Admitting: Family Medicine

## 2013-09-02 VITALS — BP 102/80 | HR 94 | Temp 97.6°F | Ht 62.5 in | Wt 140.0 lb

## 2013-09-02 DIAGNOSIS — E559 Vitamin D deficiency, unspecified: Secondary | ICD-10-CM

## 2013-09-02 DIAGNOSIS — E663 Overweight: Secondary | ICD-10-CM

## 2013-09-02 DIAGNOSIS — E1165 Type 2 diabetes mellitus with hyperglycemia: Secondary | ICD-10-CM

## 2013-09-02 DIAGNOSIS — IMO0002 Reserved for concepts with insufficient information to code with codable children: Secondary | ICD-10-CM

## 2013-09-02 DIAGNOSIS — IMO0001 Reserved for inherently not codable concepts without codable children: Secondary | ICD-10-CM

## 2013-09-02 DIAGNOSIS — E119 Type 2 diabetes mellitus without complications: Secondary | ICD-10-CM

## 2013-09-02 DIAGNOSIS — E049 Nontoxic goiter, unspecified: Secondary | ICD-10-CM

## 2013-09-02 HISTORY — DX: Vitamin D deficiency, unspecified: E55.9

## 2013-09-02 MED ORDER — INSULIN GLARGINE 100 UNIT/ML SOLOSTAR PEN: 24.0000 [IU] | PEN_INJECTOR | Freq: Every day | SUBCUTANEOUS | Status: DC

## 2013-09-02 NOTE — Progress Notes (Signed)
Pre visit review using our clinic review tool, if applicable. No additional management support is needed unless otherwise documented below in the visit note. 

## 2013-09-02 NOTE — Patient Instructions (Addendum)
optifast at [facility] 5 - 10 minutes [place] for weight loss  DASH Diet The DASH diet stands for "Dietary Approaches to Stop Hypertension." It is a healthy eating plan that has been shown to reduce high blood pressure (hypertension) in as little as 14 days, while also possibly providing other significant health benefits. These other health benefits include reducing the risk of breast cancer after menopause and reducing the risk of type 2 diabetes, heart disease, colon cancer, and stroke. Health benefits also include weight loss and slowing kidney failure in patients with chronic kidney disease.  DIET GUIDELINES  Limit salt (sodium). Your diet should contain less than 1500 mg of sodium daily.  Limit refined or processed carbohydrates. Your diet should include mostly whole grains. Desserts and added sugars should be used sparingly.  Include small amounts of heart-healthy fats. These types of fats include nuts, oils, and tub margarine. Limit saturated and trans fats. These fats have been shown to be harmful in the body. CHOOSING FOODS  The following food groups are based on a 2000 calorie diet. See your Registered Dietitian for individual calorie needs. Grains and Grain Products (6 to 8 servings daily)  Eat More Often: Whole-wheat bread, brown rice, whole-grain or wheat pasta, quinoa, popcorn without added fat or salt (air popped).  Eat Less Often: White bread, white pasta, white rice, cornbread. Vegetables (4 to 5 servings daily)  Eat More Often: Fresh, frozen, and canned vegetables. Vegetables may be raw, steamed, roasted, or grilled with a minimal amount of fat.  Eat Less Often/Avoid: Creamed or fried vegetables. Vegetables in a cheese sauce. Fruit (4 to 5 servings daily)  Eat More Often: All fresh, canned (in natural juice), or frozen fruits. Dried fruits without added sugar. One hundred percent fruit juice ( cup [237 mL] daily).  Eat Less Often: Dried fruits with added sugar. Canned  fruit in light or heavy syrup. [employer], Fish, and Poultry (2 servings or less daily. One serving is 3 to 4 oz [85-114 g]).  Eat More Often: Ninety percent or leaner ground beef, tenderloin, sirloin. Round cuts of beef, chicken breast, [place] breast. All fish. Grill, bake, or broil your meat. Nothing should be fried.  Eat Less Often/Avoid: Fatty cuts of meat, [place], or chicken leg, thigh, or wing. Fried cuts of meat or fish. Dairy (2 to 3 servings)  Eat More Often: Low-fat or fat-free milk, low-fat plain or light yogurt, reduced-fat or part-skim cheese.  Eat Less Often/Avoid: Milk (whole, 2%).Whole milk yogurt. Full-fat cheeses. Nuts, Seeds, and Legumes (4 to 5 servings per week)  Eat More Often: All without added salt.  Eat Less Often/Avoid: Salted nuts and seeds, canned beans with added salt. Fats and Sweets (limited)  Eat More Often: Vegetable oils, tub margarines without trans fats, sugar-free gelatin. Mayonnaise and salad dressings.  Eat Less Often/Avoid: Coconut oils, palm oils, butter, stick margarine, cream, half and half, cookies, candy, pie. FOR MORE INFORMATION The Dash Diet Eating Plan: www.dashdiet.org Document Released: 08/01/2011 Document Revised: 11/04/2011 Document Reviewed: 08/01/2011 [facility] Patient Information 2014 [place], [place].

## 2013-09-03 ENCOUNTER — Telehealth: Payer: Self-pay

## 2013-09-03 NOTE — Telephone Encounter (Signed)
Relevant patient education assigned to patient using Emmi. ° °

## 2013-09-05 ENCOUNTER — Encounter: Payer: Self-pay | Admitting: Family Medicine

## 2013-09-05 DIAGNOSIS — E663 Overweight: Secondary | ICD-10-CM

## 2013-09-05 HISTORY — DX: Overweight: E66.3

## 2013-09-05 NOTE — Assessment & Plan Note (Signed)
hgba1c 8.3 avoid simple carbs and continue current meds increase Lantus to 24 units and do not skip meals

## 2013-09-05 NOTE — Assessment & Plan Note (Signed)
Encourage dvitamin d 50000IU weekly monitor

## 2013-09-05 NOTE — Assessment & Plan Note (Signed)
Mild, encouraged DASH diet and regular exercise

## 2013-09-05 NOTE — Progress Notes (Signed)
Patient ID: Robin Barry, female   DOB: 1966-05-27, 48 y.o.   MRN: [id number] Robin Barry [id number] Feb 19, 1966 09/05/2013      Progress Note-Follow Up  Subjective  Chief Complaint  Chief Complaint  Patient presents with  . Follow-up    3 month    HPI  Patient is a 48 year old Caucasian female who is in today for followup. With some sinus pressure and congestion. No high-grade fevers chills. Some fatigue but no malaise. No chest pain, palpitations or shortness of breath. No GI or GU concerns. Taking medications as prescribed.  Past Medical History  Diagnosis Date  . Chicken pox as a child  . Measles as a child  . Allergic rhinitis     ragweed  . GERD (gastroesophageal reflux disease)   . Hypoglycemia 2006  . Anxiety   . Depression   . H/O otitis media     childhood  . Anxiety and depression   . Hemorrhoids 12/26/2011  . Enlarged thyroid 12/26/2011  . Preventative health care 12/26/2011  . Psoriasis 12/26/2011  . Diabetes mellitus type 2, uncontrolled   . Hyperlipidemia 03/23/2012  . Insomnia 03/23/2012  . Sun-damaged skin 06/23/2012  . Migraine 06/23/2012  . Wears glasses   . Full dentures   . Family history of anesthesia complication     brother cardiac arrested from fentynoll and versed  . Intraductal papilloma of Right breast 06/09/2013    Ductal excision, right breast on 07/13/13; Path: intraductal papilloma   . Unspecified vitamin D deficiency 09/02/2013    Past Surgical History  Procedure Laterality Date  . Tubal ligation  1987  . Breast ductal system excision Right 07/13/2013    Procedure: EXCISION DUCTAL SYSTEM BREAST;  Surgeon: Haywood Lasso, MD;  Location: [facility];  Service: General;  Laterality: Right;    Family History  Problem Relation Age of Onset  . COPD Mother     smokes  . Leukemia Mother     chronic lymphatic  . Depression Mother   . Cancer Mother     skin, CLL  . Hypertension Father   . Cancer Father     prostate   . Diabetes Brother 20    Type 1  . Kidney disease Brother     dialysis  . Other Son     brain hemorrhage  . Cancer Maternal Grandmother     breast  . Alzheimer's disease Maternal Grandmother   . Cancer Maternal Grandfather     melanoma  . Cancer Paternal Grandmother     breast  . Emphysema Paternal Grandmother   . Alcohol abuse Paternal Grandfather   . Cancer Paternal Grandfather     prostate  . Other Maternal Aunt     thalessemia  . Other Maternal Aunt     thalesemia   . Cancer Maternal Aunt     lung  . Other Maternal Aunt     thalesemmia    History   Social History  . Marital Status: Married    Spouse Name: N/A    Number of Children: N/A  . Years of Education: N/A   Occupational History  . Not on file.   Social History Main Topics  . Smoking status: Former Smoker -- 0.50 packs/day for 20 years    Types: Cigarettes    Quit date: 01/24/2010  . Smokeless tobacco: Never Used  . Alcohol Use: Yes     Comment: occasionally a little bit of wine  . Drug Use: No  .  Sexual Activity: Yes    Partners: Male   Other Topics Concern  . Not on file   Social History Narrative  . No narrative on file    Current Outpatient Prescriptions on File Prior to Visit  Medication Sig Dispense Refill  . ACCU-CHEK FASTCLIX LANCETS MISC 1 Units by Does not apply route 3 (three) times daily as needed. DX: 250.00  306 each  3  . Blood Glucose Calibration (ACCU-CHEK AVIVA) SOLN 1 drop by In Vitro route 4 (four) times daily as needed.  1 each  3  . Blood Glucose Monitoring Suppl (ACCU-CHEK AVIVA PLUS) W/DEVICE KIT 1 kit by Does not apply route 4 (four) times daily as needed.  1 kit  0  . cetirizine (ZYRTEC) 10 MG tablet Take 10 mg by mouth 2 (two) times daily.      Marland Kitchen co-enzyme Q-10 30 MG capsule Take 1 capsule (30 mg total) by mouth 2 (two) times daily.      Marland Kitchen EPINEPHrine (EPIPEN) 0.3 mg/0.3 mL SOAJ injection Inject into muscle in case of severe allergic reaction then proceed directly  to the ER.  2 Device  0  . glimepiride (AMARYL) 2 MG tablet Take 1 tablet (2 mg total) by mouth daily before breakfast.  90 tablet  2  . glucose blood (ACCU-CHEK AVIVA PLUS) test strip Use as instructed  300 each  3  . Insulin Pen Needle (BD PEN NEEDLE NANO U/F) 32G X 4 MM MISC As directed  100 each  3  . lisinopril (PRINIVIL,ZESTRIL) 5 MG tablet Take 1 tablet (5 mg total) by mouth daily.  90 tablet  2  . metFORMIN (GLUCOPHAGE) 500 MG tablet Take 1 tablet (500 mg total) by mouth 2 (two) times daily with a meal.  180 tablet  2  . montelukast (SINGULAIR) 10 MG tablet Take 1 tablet (10 mg total) by mouth at bedtime.  90 tablet  2  . NON FORMULARY Take 1 each by mouth daily. MEGA RED Multivitamin.      Marland Kitchen omeprazole (PRILOSEC) 10 MG capsule Take 10 mg by mouth daily.      . Probiotic Product (PROBIOTIC PEARLS ADVANTAGE) CAPS Take 1 capsule by mouth daily.      . rizatriptan (MAXALT) 10 MG tablet Take 1 tablet (10 mg total) by mouth as needed for migraine. Only fill if patient requests.  10 tablet  0  . simvastatin (ZOCOR) 20 MG tablet Take 1 tablet (20 mg total) by mouth every evening.  90 tablet  3  . Vitamin D, Ergocalciferol, (DRISDOL) 50000 UNITS CAPS capsule Take 1 capsule (50,000 Units total) by mouth every 7 (seven) days.  4 capsule  2   No current facility-administered medications on file prior to visit.    Allergies  Allergen Reactions  . Dilaudid [Hydromorphone Hcl] Swelling  . Bactrim     rash  . Cephalexin     rash  . Codeine     hallucinations  . Terramycin     Review of Systems  Review of Systems  Constitutional: Negative for fever and malaise/fatigue.  HENT: Negative for congestion.   Eyes: Negative for discharge.  Respiratory: Negative for shortness of breath.   Cardiovascular: Negative for chest pain, palpitations and leg swelling.  Gastrointestinal: Negative for nausea, abdominal pain and diarrhea.  Genitourinary: Negative for dysuria.  Musculoskeletal: Negative  for falls.  Skin: Negative for rash.  Neurological: Negative for loss of consciousness and headaches.  Endo/Heme/Allergies: Negative for polydipsia.  Psychiatric/Behavioral: Negative  for depression and suicidal ideas. The patient is not nervous/anxious and does not have insomnia.     Objective  BP 102/80  Pulse 94  Temp(Src) 97.6 F (36.4 C) (Oral)  Ht 5' 2.5" (1.588 m)  Wt 140 lb (63.504 kg)  BMI 25.18 kg/m2  SpO2 97%  LMP 07/05/2013  Physical Exam  Physical Exam  Constitutional: She is oriented to person, place, and time and well-developed, well-nourished, and in no distress. No distress.  HENT:  Head: Normocephalic and atraumatic.  Eyes: Conjunctivae are normal.  Neck: Neck supple. No thyromegaly present.  Cardiovascular: Normal rate, regular rhythm and normal heart sounds.   No murmur heard. Pulmonary/Chest: Effort normal and breath sounds normal. She has no wheezes.  Abdominal: She exhibits no distension and no mass.  Musculoskeletal: She exhibits no edema.  Lymphadenopathy:    She has no cervical adenopathy.  Neurological: She is alert and oriented to person, place, and time.  Skin: Skin is warm and dry. No rash noted. She is not diaphoretic.  Psychiatric: Memory, affect and judgment normal.    Lab Results  Component Value Date   TSH 1.037 08/24/2013   Lab Results  Component Value Date   WBC 5.7 08/24/2013   HGB 12.5 08/24/2013   HCT 36.9 08/24/2013   MCV 86.6 08/24/2013   PLT 268 08/24/2013   Lab Results  Component Value Date   CREATININE 0.68 08/24/2013   BUN 6 08/24/2013   NA 139 08/24/2013   K 4.0 08/24/2013   CL 104 08/24/2013   CO2 27 08/24/2013   Lab Results  Component Value Date   ALT 14 08/24/2013   AST 17 08/24/2013   ALKPHOS 54 08/24/2013   BILITOT 0.4 08/24/2013   Lab Results  Component Value Date   CHOL 154 08/24/2013   Lab Results  Component Value Date   HDL 51 08/24/2013   Lab Results  Component Value Date   LDLCALC 83  08/24/2013   Lab Results  Component Value Date   TRIG 98 08/24/2013   Lab Results  Component Value Date   CHOLHDL 3.0 08/24/2013     Assessment & Plan  Diabetes mellitus type 2, uncontrolled hgba1c 8.3 avoid simple carbs and continue current meds increase Lantus to 24 units and do not skip meals  Unspecified vitamin D deficiency Encourage dvitamin d 50000IU weekly monitor  Enlarged thyroid  Thyroid labs normal  Overweight Mild, encouraged DASH diet and regular exercise

## 2013-09-05 NOTE — Assessment & Plan Note (Addendum)
Thyroid labs normal

## 2013-09-10 ENCOUNTER — Encounter: Payer: Self-pay | Admitting: Family Medicine

## 2013-09-10 DIAGNOSIS — IMO0002 Reserved for concepts with insufficient information to code with codable children: Secondary | ICD-10-CM

## 2013-09-10 DIAGNOSIS — E1165 Type 2 diabetes mellitus with hyperglycemia: Secondary | ICD-10-CM

## 2013-09-10 MED ORDER — NEEDLES & SYRINGES MISC: Status: AC

## 2013-09-27 ENCOUNTER — Telehealth: Payer: Self-pay | Admitting: *Deleted

## 2013-09-27 NOTE — Telephone Encounter (Signed)
Pt called stating she has nasal congestion, drainage and cough. Still has generic tussionex on hand and has mucinex sinus max. Wants to know if it is ok for her to take these medications together.  Advised pt to check with her pharmacist if she is wanting to try the OTC route first as some of the decongestants are not recommended if pt's with hypertension. Advised her to let us know if otc treatment doesn't work and she will need appt for evaluation. Pt voices understanding.

## 2013-11-02 ENCOUNTER — Telehealth: Payer: Self-pay | Admitting: Family Medicine

## 2013-11-02 NOTE — Telephone Encounter (Signed)
Patient states that she is currently in [place], [place] and has a dry cough, itchy throat, watery eyes, and stuffy nose. She would like to know if Dr. Charlett Blake would call her in something to [facility]-p# [phone]. She says that if Dr. Charlett Blake doesn't prescribe her something what OTC medicine would Dr. Charlett Blake recommend?

## 2013-11-02 NOTE — Telephone Encounter (Signed)
Patient informed and voiced understanding

## 2013-11-02 NOTE — Telephone Encounter (Signed)
So honestly for those symtpoms without fever and green phlegm I would not take prescription med. I would treat presumptive allergies and viral illness. With Probiotic such as Digestive Advanatag, zinc such as Coldeeze, plain Mucinex 600 mg twice daily. Can get allergy meds over the counter, Zyrtec 10 mg daily and Flonase nasal spray is now over the counter and that will help the nose and eye symptoms the most

## 2013-11-02 NOTE — Telephone Encounter (Signed)
Please advise 

## 2013-11-10 ENCOUNTER — Encounter: Payer: Self-pay | Admitting: Family Medicine

## 2013-11-10 DIAGNOSIS — E1165 Type 2 diabetes mellitus with hyperglycemia: Secondary | ICD-10-CM

## 2013-11-10 DIAGNOSIS — IMO0001 Reserved for inherently not codable concepts without codable children: Secondary | ICD-10-CM

## 2013-11-10 DIAGNOSIS — IMO0002 Reserved for concepts with insufficient information to code with codable children: Secondary | ICD-10-CM

## 2013-11-11 MED ORDER — METFORMIN HCL 500 MG PO TABS
500.0000 mg | ORAL_TABLET | Freq: Two times a day (BID) | ORAL | Status: DC
Start: 2013-11-11 — End: 2013-11-11

## 2013-11-11 MED ORDER — GLIMEPIRIDE 2 MG PO TABS: 2.0000 mg | ORAL_TABLET | Freq: Every day | ORAL | Status: DC

## 2013-11-11 MED ORDER — METFORMIN HCL 500 MG PO TABS
500.0000 mg | ORAL_TABLET | Freq: Two times a day (BID) | ORAL | Status: DC
Start: 2013-11-11 — End: 2014-05-31

## 2013-11-11 MED ORDER — LISINOPRIL 5 MG PO TABS: 5.0000 mg | ORAL_TABLET | Freq: Every day | ORAL | Status: DC

## 2013-11-11 MED ORDER — MONTELUKAST SODIUM 10 MG PO TABS: 10.0000 mg | ORAL_TABLET | Freq: Every day | ORAL | Status: DC

## 2013-11-11 MED ORDER — LISINOPRIL 5 MG PO TABS: 5.0000 mg | ORAL_TABLET | Freq: Every day | ORAL | Status: AC

## 2013-11-11 NOTE — Addendum Note (Signed)
Addended by: Varney Daily on: 11/11/2013 03:07 PM   Modules accepted: Orders

## 2013-12-08 ENCOUNTER — Telehealth: Payer: Self-pay | Admitting: *Deleted

## 2013-12-08 DIAGNOSIS — E785 Hyperlipidemia, unspecified: Secondary | ICD-10-CM

## 2013-12-08 DIAGNOSIS — E1165 Type 2 diabetes mellitus with hyperglycemia: Principal | ICD-10-CM

## 2013-12-08 DIAGNOSIS — Z79899 Other long term (current) drug therapy: Secondary | ICD-10-CM

## 2013-12-08 DIAGNOSIS — IMO0001 Reserved for inherently not codable concepts without codable children: Secondary | ICD-10-CM

## 2013-12-08 DIAGNOSIS — Z0000 Encounter for general adult medical examination without abnormal findings: Secondary | ICD-10-CM

## 2013-12-08 LAB — CBC WITH DIFFERENTIAL/PLATELET
Basophils Absolute: 0 K/uL (ref 0.0–0.1)
Basophils Relative: 0 % (ref 0–1)
Eosinophils Absolute: 0.1 K/uL (ref 0.0–0.7)
Eosinophils Relative: 2 % (ref 0–5)
HCT: 36.5 % (ref 36.0–46.0)
Hemoglobin: 12.6 g/dL (ref 12.0–15.0)
Lymphocytes Relative: 34 % (ref 12–46)
Lymphs Abs: 2 K/uL (ref 0.7–4.0)
MCH: 29.7 pg (ref 26.0–34.0)
MCHC: 34.5 g/dL (ref 30.0–36.0)
MCV: 86.1 fL (ref 78.0–100.0)
Monocytes Absolute: 0.5 K/uL (ref 0.1–1.0)
Monocytes Relative: 8 % (ref 3–12)
Neutro Abs: 3.2 K/uL (ref 1.7–7.7)
Neutrophils Relative %: 56 % (ref 43–77)
Platelets: 325 K/uL (ref 150–400)
RBC: 4.24 MIL/uL (ref 3.87–5.11)
RDW: 14.9 % (ref 11.5–15.5)
WBC: 5.8 K/uL (ref 4.0–10.5)

## 2013-12-08 LAB — HEPATIC FUNCTION PANEL
ALT: 14 U/L (ref 0–35)
AST: 14 U/L (ref 0–37)
Albumin: 3.9 g/dL (ref 3.5–5.2)
Alkaline Phosphatase: 52 U/L (ref 39–117)
Bilirubin, Direct: 0.1 mg/dL (ref 0.0–0.3)
Indirect Bilirubin: 0.3 mg/dL (ref 0.2–1.2)
Total Bilirubin: 0.4 mg/dL (ref 0.2–1.2)
Total Protein: 6.2 g/dL (ref 6.0–8.3)

## 2013-12-08 LAB — BASIC METABOLIC PANEL WITH GFR
BUN: 9 mg/dL (ref 6–23)
CO2: 27 meq/L (ref 19–32)
Calcium: 9.2 mg/dL (ref 8.4–10.5)
Chloride: 102 meq/L (ref 96–112)
Creat: 0.72 mg/dL (ref 0.50–1.10)
Glucose, Bld: 119 mg/dL — ABNORMAL HIGH (ref 70–99)
Potassium: 4.6 meq/L (ref 3.5–5.3)
Sodium: 138 meq/L (ref 135–145)

## 2013-12-08 LAB — TSH: TSH: 1.014 u[IU]/mL (ref 0.350–4.500)

## 2013-12-08 LAB — HEMOGLOBIN A1C
Hgb A1c MFr Bld: 7.8 % — ABNORMAL HIGH
Mean Plasma Glucose: 177 mg/dL — ABNORMAL HIGH

## 2013-12-08 LAB — LIPID PANEL
Cholesterol: 175 mg/dL (ref 0–200)
HDL: 46 mg/dL
LDL Cholesterol: 104 mg/dL — ABNORMAL HIGH (ref 0–99)
Total CHOL/HDL Ratio: 3.8 ratio
Triglycerides: 124 mg/dL
VLDL: 25 mg/dL (ref 0–40)

## 2013-12-08 NOTE — Telephone Encounter (Signed)
Patient presented to lab without any orders placed in system; upcoming 3-mth follow-up, orders placed according to prior orders/SLS

## 2013-12-14 ENCOUNTER — Ambulatory Visit (INDEPENDENT_AMBULATORY_CARE_PROVIDER_SITE_OTHER): Payer: BC Managed Care – PPO | Admitting: Family Medicine

## 2013-12-14 ENCOUNTER — Encounter: Payer: Self-pay | Admitting: Family Medicine

## 2013-12-14 VITALS — BP 112/82 | HR 73 | Temp 98.0°F | Ht 62.5 in | Wt 143.0 lb

## 2013-12-14 DIAGNOSIS — IMO0001 Reserved for inherently not codable concepts without codable children: Secondary | ICD-10-CM

## 2013-12-14 DIAGNOSIS — E785 Hyperlipidemia, unspecified: Secondary | ICD-10-CM

## 2013-12-14 DIAGNOSIS — K219 Gastro-esophageal reflux disease without esophagitis: Secondary | ICD-10-CM

## 2013-12-14 DIAGNOSIS — E559 Vitamin D deficiency, unspecified: Secondary | ICD-10-CM

## 2013-12-14 DIAGNOSIS — E119 Type 2 diabetes mellitus without complications: Secondary | ICD-10-CM

## 2013-12-14 DIAGNOSIS — IMO0002 Reserved for concepts with insufficient information to code with codable children: Secondary | ICD-10-CM

## 2013-12-14 DIAGNOSIS — J309 Allergic rhinitis, unspecified: Secondary | ICD-10-CM

## 2013-12-14 DIAGNOSIS — E1165 Type 2 diabetes mellitus with hyperglycemia: Secondary | ICD-10-CM

## 2013-12-14 DIAGNOSIS — E663 Overweight: Secondary | ICD-10-CM

## 2013-12-14 MED ORDER — INSULIN GLARGINE 100 UNIT/ML SOLOSTAR PEN: 26.0000 [IU] | PEN_INJECTOR | Freq: Every day | SUBCUTANEOUS | Status: AC

## 2013-12-14 MED ORDER — GLIMEPIRIDE 1 MG PO TABS: 1.0000 mg | ORAL_TABLET | Freq: Every day | ORAL | Status: AC

## 2013-12-14 NOTE — Progress Notes (Signed)
Pre visit review using our clinic review tool, if applicable. No additional management support is needed unless otherwise documented below in the visit note. 

## 2013-12-14 NOTE — Progress Notes (Signed)
Patient ID: Robin Barry, female   DOB: [birth year], 48 y.o.   MRN: [id number] Robin Barry [id number] May 17, 1966 12/14/2013      Progress Note-Follow Up  Subjective  Chief Complaint  Chief Complaint  Patient presents with  . Follow-up    3 month    HPI  Patient is a 48 year old female in today for routine medical care. She is doing fairly well. She has just started a new diet 20 126 and she does think she feels somewhat better. She reports her a.m. blood sugars have ranged from 70-150 but are generally over 100 and her p.m. sugars are 150-180. She denies polyuria or polydipsia. No recent illness. Denies CP/palp/SOB/HA/congestion/fevers/GI or GU c/o. Taking meds as prescribed  Past Medical History  Diagnosis Date  . Chicken pox as a child  . Measles as a child  . Allergic rhinitis     ragweed  . GERD (gastroesophageal reflux disease)   . Hypoglycemia 2006  . Anxiety   . Depression   . H/O otitis media     childhood  . Anxiety and depression   . Hemorrhoids 12/26/2011  . Enlarged thyroid 12/26/2011  . Preventative health care 12/26/2011  . Psoriasis 12/26/2011  . Diabetes mellitus type 2, uncontrolled   . Hyperlipidemia 03/23/2012  . Insomnia 03/23/2012  . Sun-damaged skin 06/23/2012  . Migraine 06/23/2012  . Wears glasses   . Full dentures   . Family history of anesthesia complication     brother cardiac arrested from fentynoll and versed  . Intraductal papilloma of Right breast 06/09/2013    Ductal excision, right breast on 07/13/13; Path: intraductal papilloma   . Unspecified vitamin D deficiency 09/02/2013  . Overweight 09/05/2013    Past Surgical History  Procedure Laterality Date  . Tubal ligation  1987  . Breast ductal system excision Right 07/13/2013    Procedure: EXCISION DUCTAL SYSTEM BREAST;  Surgeon: Haywood Lasso, MD;  Location: [facility];  Service: General;  Laterality: Right;    Family History  Problem Relation Age of Onset  . COPD  Mother     smokes  . Leukemia Mother     chronic lymphatic  . Depression Mother   . Cancer Mother     skin, CLL  . Hypertension Father   . Cancer Father     prostate  . Diabetes Brother 20    Type 1  . Kidney disease Brother     dialysis  . Other Son     brain hemorrhage  . Cancer Maternal Grandmother     breast  . Alzheimer's disease Maternal Grandmother   . Cancer Maternal Grandfather     melanoma  . Cancer Paternal Grandmother     breast  . Emphysema Paternal Grandmother   . Alcohol abuse Paternal Grandfather   . Cancer Paternal Grandfather     prostate  . Other Maternal Aunt     thalessemia  . Other Maternal Aunt     thalesemia   . Cancer Maternal Aunt     lung  . Other Maternal Aunt     thalesemmia    History   Social History  . Marital Status: Married    Spouse Name: N/A    Number of Children: N/A  . Years of Education: N/A   Occupational History  . Not on file.   Social History Main Topics  . Smoking status: Former Smoker -- 0.50 packs/day for 20 years    Types: Cigarettes  Quit date: 01/24/2010  . Smokeless tobacco: Never Used  . Alcohol Use: Yes     Comment: occasionally a little bit of wine  . Drug Use: No  . Sexual Activity: Yes    Partners: Male   Other Topics Concern  . Not on file   Social History Narrative  . No narrative on file    Current Outpatient Prescriptions on File Prior to Visit  Medication Sig Dispense Refill  . ACCU-CHEK FASTCLIX LANCETS MISC 1 Units by Does not apply route 3 (three) times daily as needed. DX: 250.00  306 each  3  . Blood Glucose Calibration (ACCU-CHEK AVIVA) SOLN 1 drop by In Vitro route 4 (four) times daily as needed.  1 each  3  . Blood Glucose Monitoring Suppl (ACCU-CHEK AVIVA PLUS) W/DEVICE KIT 1 kit by Does not apply route 4 (four) times daily as needed.  1 kit  0  . cetirizine (ZYRTEC) 10 MG tablet Take 10 mg by mouth 2 (two) times daily.      Marland Kitchen co-enzyme Q-10 30 MG capsule Take 1 capsule (30  mg total) by mouth 2 (two) times daily.      Marland Kitchen EPINEPHrine (EPIPEN) 0.3 mg/0.3 mL SOAJ injection Inject into muscle in case of severe allergic reaction then proceed directly to the ER.  2 Device  0  . glimepiride (AMARYL) 2 MG tablet Take 1 tablet (2 mg total) by mouth daily before breakfast.  90 tablet  1  . glucose blood (ACCU-CHEK AVIVA PLUS) test strip Use as instructed  300 each  3  . Insulin Glargine (LANTUS) 100 UNIT/ML Solostar Pen Inject 24 Units into the skin daily. DX 250.00 Sliding Scale +2 Please dispense enough for 90 day supply  15 mL    . Insulin Pen Needle (BD PEN NEEDLE NANO U/F) 32G X 4 MM MISC As directed  100 each  3  . lisinopril (PRINIVIL,ZESTRIL) 5 MG tablet Take 1 tablet (5 mg total) by mouth daily.  90 tablet  1  . metFORMIN (GLUCOPHAGE) 500 MG tablet Take 1 tablet (500 mg total) by mouth 2 (two) times daily with a meal.  180 tablet  1  . montelukast (SINGULAIR) 10 MG tablet Take 1 tablet (10 mg total) by mouth at bedtime.  90 tablet  1  . Needles & Syringes MISC Short needles and syringes. For Diabetes. Check qid prn DX: 250.02  100 each  0  . NON FORMULARY Take 1 each by mouth daily. MEGA RED Multivitamin.      Marland Kitchen omeprazole (PRILOSEC) 10 MG capsule Take 10 mg by mouth daily.      . Probiotic Product (PROBIOTIC PEARLS ADVANTAGE) CAPS Take 1 capsule by mouth daily.      . rizatriptan (MAXALT) 10 MG tablet Take 1 tablet (10 mg total) by mouth as needed for migraine. Only fill if patient requests.  10 tablet  0  . simvastatin (ZOCOR) 20 MG tablet Take 1 tablet (20 mg total) by mouth every evening.  90 tablet  3  . Vitamin D, Ergocalciferol, (DRISDOL) 50000 UNITS CAPS capsule Take 1 capsule (50,000 Units total) by mouth every 7 (seven) days.  4 capsule  2   No current facility-administered medications on file prior to visit.    Allergies  Allergen Reactions  . Dilaudid [Hydromorphone Hcl] Swelling  . Bactrim     rash  . Cephalexin     rash  . Codeine      hallucinations  . Terramycin  Review of Systems  Review of Systems  Constitutional: Negative for fever and malaise/fatigue.  HENT: Negative for congestion.   Eyes: Negative for discharge.  Respiratory: Negative for shortness of breath.   Cardiovascular: Negative for chest pain, palpitations and leg swelling.  Gastrointestinal: Negative for nausea, abdominal pain and diarrhea.  Genitourinary: Negative for dysuria.  Musculoskeletal: Negative for falls.  Skin: Negative for rash.  Neurological: Negative for loss of consciousness and headaches.  Endo/Heme/Allergies: Negative for polydipsia.  Psychiatric/Behavioral: Negative for depression and suicidal ideas. The patient is not nervous/anxious and does not have insomnia.     Objective  BP 112/82  Pulse 73  Temp(Src) 98 F (36.7 C) (Oral)  Ht 5' 2.5" (1.588 m)  Wt 143 lb (64.864 kg)  BMI 25.72 kg/m2  SpO2 97%  LMP 11/28/2013  Physical Exam  Physical Exam  Constitutional: She is oriented to person, place, and time and well-developed, well-nourished, and in no distress. No distress.  HENT:  Head: Normocephalic and atraumatic.  Eyes: Conjunctivae are normal.  Neck: Neck supple. No thyromegaly present.  Cardiovascular: Normal rate, regular rhythm and normal heart sounds.   No murmur heard. Pulmonary/Chest: Effort normal and breath sounds normal. She has no wheezes.  Abdominal: She exhibits no distension and no mass.  Musculoskeletal: She exhibits no edema.  Lymphadenopathy:    She has no cervical adenopathy.  Neurological: She is alert and oriented to person, place, and time.  Skin: Skin is warm and dry. No rash noted. She is not diaphoretic.  Psychiatric: Memory, affect and judgment normal.    Lab Results  Component Value Date   TSH 1.014 12/08/2013   Lab Results  Component Value Date   WBC 5.8 12/08/2013   HGB 12.6 12/08/2013   HCT 36.5 12/08/2013   MCV 86.1 12/08/2013   PLT 325 12/08/2013   Lab Results   Component Value Date   CREATININE 0.72 12/08/2013   BUN 9 12/08/2013   NA 138 12/08/2013   K 4.6 12/08/2013   CL 102 12/08/2013   CO2 27 12/08/2013   Lab Results  Component Value Date   ALT 14 12/08/2013   AST 14 12/08/2013   ALKPHOS 52 12/08/2013   BILITOT 0.4 12/08/2013   Lab Results  Component Value Date   CHOL 175 12/08/2013   Lab Results  Component Value Date   HDL 46 12/08/2013   Lab Results  Component Value Date   LDLCALC 104* 12/08/2013   Lab Results  Component Value Date   TRIG 124 12/08/2013   Lab Results  Component Value Date   CHOLHDL 3.8 12/08/2013     Assessment & Plan   Overweight Encouraged DASH diet, decrease po intake and increase exercise as tolerated. Needs 7-8 hours of sleep nightly. Avoid trans fats, eat small, frequent meals every 4-5 hours with lean proteins, complex carbs and healthy fats. Minimize simple carbs, GMO foods. Has started a new diet called the 21 day fix  GERD (gastroesophageal reflux disease) Avoid offending foods, start probiotics. Do not eat large meals in late evening and consider raising head of bed.   Diabetes mellitus type 2, uncontrolled hgba1c improving at 7.8, minimize simple carbs. Increase exercise as tolerated. Continue current meds but increase Levemir by 2 units and then can continue to increase by 2 units every 3 days if continues to run hi  Allergic rhinitis Goo response to Cetirizine  Hyperlipidemia Encouraged heart healthy diet, increase exercise, avoid trans fats, consider a krill oil cap daily

## 2013-12-14 NOTE — Patient Instructions (Signed)
ADD VITAMIN D TI NEXT BLOOD DRAW    Basic Carbohydrate Counting Basic carbohydrate counting is a way to plan meals. It is done by counting the amount of carbohydrate in foods. Foods that have carbohydrates are starches (grains, beans, starchy vegetables) and sweets. Eating carbohydrates increases blood glucose (sugar) levels. People with diabetes use carbohydrate counting to help keep their blood glucose at a normal level.  COUNTING CARBOHYDRATES IN FOODS The first step in counting carbohydrates is to learn how many carbohydrate servings you should have in every meal. A dietitian can plan this for you. After learning the amount of carbohydrates to include in your meal plan, you can start to choose the carbohydrate-containing foods you want to eat.  There are 2 ways to identify the amount of carbohydrates in the foods you eat.  Read the Nutrition Facts panel on food labels. You need 2 pieces of information from the Nutrition Facts panel to count carbohydrates this way:  Serving size.  Total carbohydrate (in grams). Decide how many servings you will be eating. If it is 1 serving, you will be eating the amount of carbohydrate listed on the panel. If you will be eating 2 servings, you will be eating double the amount of carbohydrate listed on the panel.   Learn serving sizes. A serving size of most carbohydrate-containing foods is about 15 grams (g). Listed below are single serving sizes of common carbohydrate-containing foods:  1 slice bread.   cup unsweetened, dry cereal.   cup hot cereal.   cup rice.   cup mashed potatoes.   cup pasta.  1 cup fresh fruit.   cup canned fruit.  1 cup milk (whole, 2%, or skim).   cup starchy vegetables (peas, corn, or potatoes). Counting carbohydrates this way is similar to looking on the Nutrition Facts panel. Decide how many servings you will eat first. Multiply the number of servings you eat by 15 g. For example, if you have 2 cups of  strawberries, you had 2 servings. That means you had 30 g of carbohydrate (2 servings x 15 g = 30 g). CALCULATING CARBOHYDRATES IN A MEAL Sample dinner  3 oz chicken breast.   cup brown rice.   cup corn.  1 cup fat-free milk.  1 cup strawberries with sugar-free whipped topping. Carbohydrate calculation First, identify the foods that contain carbohydrate:  Rice.  Corn.  Milk.  Strawberries. Calculate the number of servings eaten:  2 servings rice.  1 serving corn.  1 serving milk.  1 serving strawberries. Multiply the number of servings by 15 g:  2 servings rice x 15 g = 30 g.  1 serving corn x 15 g = 15 g.  1 serving milk x 15 g = 15 g.  1 serving strawberries x 15 g = 15 g. Add the amounts to find the total carbohydrates eaten: 30 g + 15 g + 15 g + 15 g = 75 g carbohydrate eaten at dinner. Document Released: 08/12/2005 Document Revised: 11/04/2011 Document Reviewed: 06/28/2011 [facility] Patient Information 2014 [place], [place].

## 2013-12-15 ENCOUNTER — Encounter: Payer: Self-pay | Admitting: Family Medicine

## 2013-12-15 LAB — VITAMIN D 25 HYDROXY (VIT D DEFICIENCY, FRACTURES): VIT D 25 HYDROXY: 36 ng/mL (ref 30–89)

## 2013-12-19 NOTE — Assessment & Plan Note (Signed)
hgba1c improving at 7.8, minimize simple carbs. Increase exercise as tolerated. Continue current meds but increase Levemir by 2 units and then can continue to increase by 2 units every 3 days if continues to run hi

## 2013-12-19 NOTE — Assessment & Plan Note (Addendum)
Encouraged DASH diet, decrease po intake and increase exercise as tolerated. Needs 7-8 hours of sleep nightly. Avoid trans fats, eat small, frequent meals every 4-5 hours with lean proteins, complex carbs and healthy fats. Minimize simple carbs, GMO foods. Has started a new diet called the 21 day fix

## 2013-12-19 NOTE — Assessment & Plan Note (Signed)
Avoid offending foods, start probiotics. Do not eat large meals in late evening and consider raising head of bed.  

## 2013-12-19 NOTE — Assessment & Plan Note (Signed)
Encouraged heart healthy diet, increase exercise, avoid trans fats, consider a krill oil cap daily 

## 2013-12-19 NOTE — Assessment & Plan Note (Signed)
Goo response to Cetirizine

## 2014-03-17 ENCOUNTER — Telehealth: Payer: Self-pay | Admitting: Family Medicine

## 2014-03-17 DIAGNOSIS — E119 Type 2 diabetes mellitus without complications: Secondary | ICD-10-CM

## 2014-03-17 DIAGNOSIS — E785 Hyperlipidemia, unspecified: Secondary | ICD-10-CM

## 2014-03-17 LAB — CBC
HCT: 37.2 % (ref 36.0–46.0)
HEMOGLOBIN: 12.6 g/dL (ref 12.0–15.0)
MCH: 29.1 pg (ref 26.0–34.0)
MCHC: 33.9 g/dL (ref 30.0–36.0)
MCV: 85.9 fL (ref 78.0–100.0)
Platelets: 323 10*3/uL (ref 150–400)
RBC: 4.33 MIL/uL (ref 3.87–5.11)
RDW: 13.7 % (ref 11.5–15.5)
WBC: 7.5 10*3/uL (ref 4.0–10.5)

## 2014-03-17 LAB — HEPATIC FUNCTION PANEL
ALT: 13 U/L (ref 0–35)
AST: 12 U/L (ref 0–37)
Albumin: 4.1 g/dL (ref 3.5–5.2)
Alkaline Phosphatase: 69 U/L (ref 39–117)
BILIRUBIN DIRECT: 0.1 mg/dL (ref 0.0–0.3)
BILIRUBIN INDIRECT: 0.2 mg/dL (ref 0.2–1.2)
BILIRUBIN TOTAL: 0.3 mg/dL (ref 0.2–1.2)
Total Protein: 6.9 g/dL (ref 6.0–8.3)

## 2014-03-17 LAB — LIPID PANEL
CHOLESTEROL: 151 mg/dL (ref 0–200)
HDL: 44 mg/dL (ref >39)
LDL CALC: 72 mg/dL (ref 0–99)
Total CHOL/HDL Ratio: 3.4 Ratio
Triglycerides: 177 mg/dL — ABNORMAL HIGH (ref <150)
VLDL: 35 mg/dL (ref 0–40)

## 2014-03-17 LAB — HEMOGLOBIN A1C
Hgb A1c MFr Bld: 9.4 % — ABNORMAL HIGH (ref <5.7)
Mean Plasma Glucose: 223 mg/dL — ABNORMAL HIGH (ref <117)

## 2014-03-17 LAB — RENAL FUNCTION PANEL
Albumin: 4.1 g/dL (ref 3.5–5.2)
BUN: 13 mg/dL (ref 6–23)
CALCIUM: 9.4 mg/dL (ref 8.4–10.5)
CO2: 27 mEq/L (ref 19–32)
Chloride: 101 mEq/L (ref 96–112)
Creat: 0.78 mg/dL (ref 0.50–1.10)
Glucose, Bld: 152 mg/dL — ABNORMAL HIGH (ref 70–99)
PHOSPHORUS: 3.7 mg/dL (ref 2.3–4.6)
POTASSIUM: 4.1 meq/L (ref 3.5–5.3)
SODIUM: 138 meq/L (ref 135–145)

## 2014-03-17 LAB — TSH: TSH: 2.481 u[IU]/mL (ref 0.350–4.500)

## 2014-03-17 NOTE — Telephone Encounter (Signed)
Presented for labwork

## 2014-03-21 ENCOUNTER — Ambulatory Visit (INDEPENDENT_AMBULATORY_CARE_PROVIDER_SITE_OTHER): Payer: BC Managed Care – PPO | Admitting: Family Medicine

## 2014-03-21 ENCOUNTER — Encounter: Payer: Self-pay | Admitting: Family Medicine

## 2014-03-21 VITALS — BP 118/78 | HR 88 | Temp 98.1°F | Ht 62.5 in | Wt 139.0 lb

## 2014-03-21 DIAGNOSIS — F411 Generalized anxiety disorder: Secondary | ICD-10-CM

## 2014-03-21 DIAGNOSIS — E785 Hyperlipidemia, unspecified: Secondary | ICD-10-CM

## 2014-03-21 DIAGNOSIS — E1165 Type 2 diabetes mellitus with hyperglycemia: Secondary | ICD-10-CM

## 2014-03-21 DIAGNOSIS — IMO0001 Reserved for inherently not codable concepts without codable children: Secondary | ICD-10-CM

## 2014-03-21 DIAGNOSIS — G4700 Insomnia, unspecified: Secondary | ICD-10-CM

## 2014-03-21 DIAGNOSIS — L408 Other psoriasis: Secondary | ICD-10-CM

## 2014-03-21 DIAGNOSIS — F329 Major depressive disorder, single episode, unspecified: Secondary | ICD-10-CM

## 2014-03-21 DIAGNOSIS — L409 Psoriasis, unspecified: Secondary | ICD-10-CM

## 2014-03-21 DIAGNOSIS — F419 Anxiety disorder, unspecified: Secondary | ICD-10-CM

## 2014-03-21 DIAGNOSIS — K219 Gastro-esophageal reflux disease without esophagitis: Secondary | ICD-10-CM

## 2014-03-21 DIAGNOSIS — J019 Acute sinusitis, unspecified: Secondary | ICD-10-CM

## 2014-03-21 DIAGNOSIS — F341 Dysthymic disorder: Secondary | ICD-10-CM

## 2014-03-21 DIAGNOSIS — L259 Unspecified contact dermatitis, unspecified cause: Secondary | ICD-10-CM

## 2014-03-21 DIAGNOSIS — F32A Depression, unspecified: Secondary | ICD-10-CM

## 2014-03-21 DIAGNOSIS — L309 Dermatitis, unspecified: Secondary | ICD-10-CM

## 2014-03-21 DIAGNOSIS — IMO0002 Reserved for concepts with insufficient information to code with codable children: Secondary | ICD-10-CM

## 2014-03-21 MED ORDER — TRIAMCINOLONE ACETONIDE 0.1 % EX CREA: 1.0000 "application " | TOPICAL_CREAM | Freq: Two times a day (BID) | CUTANEOUS | Status: AC | PRN

## 2014-03-21 MED ORDER — ALPRAZOLAM 0.25 MG PO TABS: 0.2500 mg | ORAL_TABLET | Freq: Two times a day (BID) | ORAL | Status: AC | PRN

## 2014-03-21 NOTE — Assessment & Plan Note (Signed)
Refill given on Alprazolam which she uses infrequently is moving back to [facility] for family reasons encouraged to consider daily SSRI if symptoms persist or worsen

## 2014-03-21 NOTE — Patient Instructions (Signed)

## 2014-03-21 NOTE — Assessment & Plan Note (Signed)
Given refill on Triamcinolone for hand

## 2014-03-21 NOTE — Assessment & Plan Note (Signed)
Avoid offending foods, start probiotics. Do not eat large meals in late evening and consider raising head of bed.  

## 2014-03-21 NOTE — Progress Notes (Signed)
Patient ID: Robin Barry, female   DOB: 06/25/1966, 48 y.o.   MRN: [id number] Robin Barry [id number] 1966-07-31 03/21/2014      Progress Note-Follow Up  Subjective  Chief Complaint  Chief Complaint  Patient presents with  . Follow-up    3 month    HPI  Patient is a 48 year old female in today for routine medical care. She is in today for followup on labs before moving to [place]. Unfortunately her A1c has increased. She has had one sugar as low as 70 recently and she did feel shaky. She notices this was after skipping a meal. She has recently been treated for sinusitis azithromycin and is improving. She does still have a cough somewhat at nighttime but does not tolerate most cough suppressants. It is nonproductive. No fevers or chills. No headache or significant head congestion. Does have some mild discomfort in the right ear on occasion but no hearing changes or tinnitus. Denies CP/palp/SOB/HA/congestion/fevers/GI or GU c/o. Taking meds as prescribed  Past Medical History  Diagnosis Date  . Chicken pox as a child  . Measles as a child  . Allergic rhinitis     ragweed  . GERD (gastroesophageal reflux disease)   . Hypoglycemia 2006  . Anxiety   . Depression   . H/O otitis media     childhood  . Anxiety and depression   . Hemorrhoids 12/26/2011  . Enlarged thyroid 12/26/2011  . Preventative health care 12/26/2011  . Psoriasis 12/26/2011  . Diabetes mellitus type 2, uncontrolled   . Hyperlipidemia 03/23/2012  . Insomnia 03/23/2012  . Sun-damaged skin 06/23/2012  . Migraine 06/23/2012  . Wears glasses   . Full dentures   . Family history of anesthesia complication     brother cardiac arrested from [place]entynoll and versed  . Intraductal papilloma of Right breast 06/09/2013    Ductal excision, right breast on 07/13/13; Path: intraductal papilloma   . Unspecified vitamin D deficiency 09/02/2013  . Overweight 09/05/2013    Past Surgical History  Procedure Laterality Date  . Tubal  ligation  1987  . Breast ductal system excision Right 07/13/2013    Procedure: EXCISION DUCTAL SYSTEM BREAST;  Surgeon: Haywood Lasso, MD;  Location: [facility];  Service: General;  Laterality: Right;    Family History  Problem Relation Age of Onset  . COPD Mother     smokes  . Leukemia Mother     chronic lymphatic  . Depression Mother   . Cancer Mother     skin, CLL  . Hypertension Father   . Cancer Father     prostate  . Diabetes Brother 20    Type 1  . Kidney disease Brother     dialysis  . Other Son     brain hemorrhage  . Cancer Maternal Grandmother     breast  . Alzheimer's disease Maternal Grandmother   . Cancer Maternal Grandfather     melanoma  . Cancer Paternal Grandmother     breast  . Emphysema Paternal Grandmother   . Alcohol abuse Paternal Grandfather   . Cancer Paternal Grandfather     prostate  . Other Maternal Aunt     thalessemia  . Other Maternal Aunt     thalesemia   . Cancer Maternal Aunt     lung  . Other Maternal Aunt     thalesemmia    History   Social History  . Marital Status: Married    Spouse Name: N/A  Number of Children: N/A  . Years of Education: N/A   Occupational History  . Not on file.   Social History Main Topics  . Smoking status: Former Smoker -- 0.50 packs/day for 20 years    Types: Cigarettes    Quit date: 01/24/2010  . Smokeless tobacco: Never Used  . Alcohol Use: Yes     Comment: occasionally a little bit of wine  . Drug Use: No  . Sexual Activity: Yes    Partners: Male   Other Topics Concern  . Not on file   Social History Narrative  . No narrative on file    Current Outpatient Prescriptions on File Prior to Visit  Medication Sig Dispense Refill  . ACCU-CHEK FASTCLIX LANCETS MISC 1 Units by Does not apply route 3 (three) times daily as needed. DX: 250.00  306 each  3  . Blood Glucose Calibration (ACCU-CHEK AVIVA) SOLN 1 drop by In Vitro route 4 (four) times daily as needed.  1  each  3  . Blood Glucose Monitoring Suppl (ACCU-CHEK AVIVA PLUS) W/DEVICE KIT 1 kit by Does not apply route 4 (four) times daily as needed.  1 kit  0  . cetirizine (ZYRTEC) 10 MG tablet Take 10 mg by mouth 2 (two) times daily.      Marland Kitchen co-enzyme Q-10 30 MG capsule Take 1 capsule (30 mg total) by mouth 2 (two) times daily.      Marland Kitchen EPINEPHrine (EPIPEN) 0.3 mg/0.3 mL SOAJ injection Inject into muscle in case of severe allergic reaction then proceed directly to the ER.  2 Device  0  . glimepiride (AMARYL) 1 MG tablet Take 1 tablet (1 mg total) by mouth daily with breakfast.  90 tablet  0  . glucose blood (ACCU-CHEK AVIVA PLUS) test strip Use as instructed  300 each  3  . Insulin Glargine (LANTUS) 100 UNIT/ML Solostar Pen Inject 26 Units into the skin daily. DX 250.00 Add +2 units if sugars run consistently above 80 at lowest and above 150 at highest Please dispense enough for 90 day supply  15 mL  1  . Insulin Pen Needle (BD PEN NEEDLE NANO U/F) 32G X 4 MM MISC As directed  100 each  3  . lisinopril (PRINIVIL,ZESTRIL) 5 MG tablet Take 1 tablet (5 mg total) by mouth daily.  90 tablet  1  . metFORMIN (GLUCOPHAGE) 500 MG tablet Take 1 tablet (500 mg total) by mouth 2 (two) times daily with a meal.  180 tablet  1  . montelukast (SINGULAIR) 10 MG tablet Take 1 tablet (10 mg total) by mouth at bedtime.  90 tablet  1  . Needles & Syringes MISC Short needles and syringes. For Diabetes. Check qid prn DX: 250.02  100 each  0  . NON FORMULARY Take 1 each by mouth daily. MEGA RED Multivitamin.      Marland Kitchen omeprazole (PRILOSEC) 10 MG capsule Take 10 mg by mouth daily.      . Probiotic Product (PROBIOTIC PEARLS ADVANTAGE) CAPS Take 1 capsule by mouth daily.      Marland Kitchen RELION INSULIN SYR 1CC/30G 30G X 5/16" 1 ML MISC       . rizatriptan (MAXALT) 10 MG tablet Take 1 tablet (10 mg total) by mouth as needed for migraine. Only fill if patient requests.  10 tablet  0  . simvastatin (ZOCOR) 20 MG tablet Take 1 tablet (20 mg total)  by mouth every evening.  90 tablet  3   No current  facility-administered medications on file prior to visit.    Allergies  Allergen Reactions  . Dilaudid [Hydromorphone Hcl] Swelling  . Bactrim     rash  . Cephalexin     rash  . Codeine     hallucinations  . Terramycin     Review of Systems  Review of Systems  Constitutional: Negative for fever and malaise/fatigue.  HENT: Positive for ear pain. Negative for congestion.        Right  Eyes: Negative for discharge.  Respiratory: Positive for cough. Negative for sputum production and shortness of breath.   Cardiovascular: Negative for chest pain, palpitations and leg swelling.  Gastrointestinal: Negative for nausea, abdominal pain and diarrhea.  Genitourinary: Negative for dysuria.  Musculoskeletal: Negative for falls.  Skin: Negative for rash.  Neurological: Negative for loss of consciousness and headaches.  Endo/Heme/Allergies: Negative for polydipsia.  Psychiatric/Behavioral: Negative for depression and suicidal ideas. The patient is not nervous/anxious and does not have insomnia.     Objective  BP 118/78  Pulse 88  Temp(Src) 98.1 F (36.7 C) (Oral)  Ht 5' 2.5" (1.588 m)  Wt 139 lb 0.6 oz (63.068 kg)  BMI 25.01 kg/m2  SpO2 92%  LMP 02/20/2014  Physical Exam  Physical Exam  Constitutional: She is oriented to person, place, and time and well-developed, well-nourished, and in no distress. No distress.  HENT:  Head: Normocephalic and atraumatic.  Eyes: Conjunctivae are normal.  Neck: Neck supple. No thyromegaly present.  Cardiovascular: Normal rate, regular rhythm and normal heart sounds.   No murmur heard. Pulmonary/Chest: Effort normal and breath sounds normal. She has no wheezes.  Abdominal: She exhibits no distension and no mass.  Musculoskeletal: She exhibits no edema.  Lymphadenopathy:    She has no cervical adenopathy.  Neurological: She is alert and oriented to person, place, and time.  Skin: Skin is  warm and dry. No rash noted. She is not diaphoretic.  Psychiatric: Memory, affect and judgment normal.    Lab Results  Component Value Date   TSH 2.481 03/17/2014   Lab Results  Component Value Date   WBC 7.5 03/17/2014   HGB 12.6 03/17/2014   HCT 37.2 03/17/2014   MCV 85.9 03/17/2014   PLT 323 03/17/2014   Lab Results  Component Value Date   CREATININE 0.78 03/17/2014   BUN 13 03/17/2014   NA 138 03/17/2014   K 4.1 03/17/2014   CL 101 03/17/2014   CO2 27 03/17/2014   Lab Results  Component Value Date   ALT 13 03/17/2014   AST 12 03/17/2014   ALKPHOS 69 03/17/2014   BILITOT 0.3 03/17/2014   Lab Results  Component Value Date   CHOL 151 03/17/2014   Lab Results  Component Value Date   HDL 44 03/17/2014   Lab Results  Component Value Date   LDLCALC 72 03/17/2014   Lab Results  Component Value Date   TRIG 177* 03/17/2014   Lab Results  Component Value Date   CHOLHDL 3.4 03/17/2014     Assessment & Plan  Sinusitis, acute Just treated with Azithromycin and improving, encouraged [facility]in Negus Colon health and zinc  Hyperlipidemia Tolerating statin, encouraged heart healthy diet, avoid trans fats, minimize simple carbs and saturated fats. Increase exercise as tolerated. TGs up due to sugar, minimize carbs  Anxiety and depression Refill given on Alprazolam which she uses infrequently is moving back to [facility] for family reasons encouraged to consider daily SSRI if symptoms persist or worsen  Psoriasis Given refill  on Triamcinolone for hand  Diabetes mellitus type 2, uncontrolled A1C up, increase Lantus to 32 units, increase protein, decrease carbs and no skipping meals. Did have a 67 when she skipped a meal  GERD (gastroesophageal reflux disease) Avoid offending foods, start probiotics. Do not eat large meals in late evening and consider raising head of bed.

## 2014-03-21 NOTE — Assessment & Plan Note (Signed)
A1C up, increase Lantus to 32 units, increase protein, decrease carbs and no skipping meals. Did have a 58 when she skipped a meal

## 2014-03-21 NOTE — Progress Notes (Signed)
Pre visit review using our clinic review tool, if applicable. No additional management support is needed unless otherwise documented below in the visit note. 

## 2014-03-21 NOTE — Assessment & Plan Note (Signed)
Just treated with Azithromycin and improving, encouraged [facility] Colon health and zinc

## 2014-03-21 NOTE — Assessment & Plan Note (Signed)
Tolerating statin, encouraged heart healthy diet, avoid trans fats, minimize simple carbs and saturated fats. Increase exercise as tolerated. TGs up due to sugar, minimize carbs

## 2014-05-31 ENCOUNTER — Encounter: Payer: Self-pay | Admitting: Family Medicine

## 2014-05-31 DIAGNOSIS — E08628 Diabetes mellitus due to underlying condition with other skin complications: Secondary | ICD-10-CM

## 2014-05-31 DIAGNOSIS — E1165 Type 2 diabetes mellitus with hyperglycemia: Secondary | ICD-10-CM

## 2014-05-31 DIAGNOSIS — IMO0002 Reserved for concepts with insufficient information to code with codable children: Secondary | ICD-10-CM

## 2014-05-31 DIAGNOSIS — E098 Drug or chemical induced diabetes mellitus with unspecified complications: Secondary | ICD-10-CM

## 2014-05-31 MED ORDER — INSULIN PEN NEEDLE 32G X 4 MM MISC: Status: AC

## 2014-05-31 MED ORDER — METFORMIN HCL 500 MG PO TABS: 500.0000 mg | ORAL_TABLET | Freq: Two times a day (BID) | ORAL | Status: AC

## 2014-05-31 MED ORDER — SIMVASTATIN 20 MG PO TABS: 20.0000 mg | ORAL_TABLET | Freq: Every evening | ORAL | Status: AC

## 2014-05-31 MED ORDER — MONTELUKAST SODIUM 10 MG PO TABS: 10.0000 mg | ORAL_TABLET | Freq: Every day | ORAL | Status: AC

## 2014-08-29 IMAGING — CT CT ABD-PELV W/O CM
1 series · 1 of 2 positions shown · non-contrast
Comparison: None.

CLINICAL DATA: Bilateral flank pain, left greater than right.
Microscopic hematuria.

EXAM:
CT ABDOMEN AND PELVIS WITHOUT CONTRAST
TECHNIQUE: Multidetector CT imaging of the abdomen and pelvis was performed
following the standard protocol without intravenous contrast.

[Series 1: topogram 0.6 t20s · coronal · 1.00mm/px · 1 of 2 slices shown]
[im 2/2]
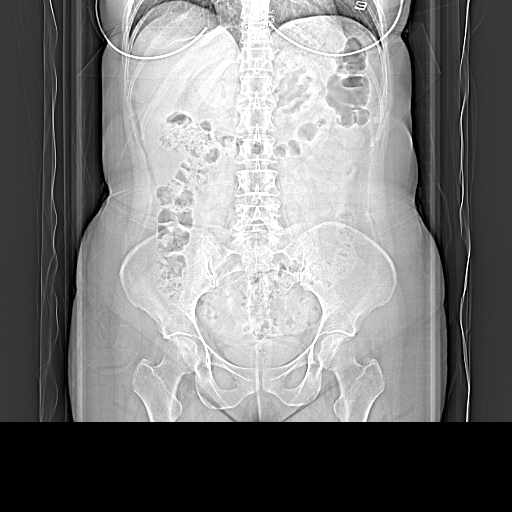

[1 of 2 positions shown; findings below may reference images not displayed]

FINDINGS: Lung bases are clear. No effusions. Heart is normal size.

Liver, spleen, pancreas, adrenals and stomach of an unremarkable
unenhanced appearance. Gallbladder is contracted. No renal or
ureteral stones. No hydronephrosis.

Appendix is visualized and is normal. Bowel grossly unremarkable. No
free fluid, free air, or adenopathy. Aorta is normal caliber. No
acute bony abnormality.
IMPRESSION: No renal or ureteral stones. No hydronephrosis.  No acute findings.

## 2018-05-12 DIAGNOSIS — E119 Type 2 diabetes mellitus without complications: Secondary | ICD-10-CM | POA: Insufficient documentation

## 2018-05-12 NOTE — Progress Notes (Unsigned)
Error
